# Patient Record
Sex: Female | Born: 1960 | Race: White | Hispanic: No | Marital: Married | Smoking: Never smoker
Health system: Southern US, Community
[De-identification: ages and names within clinical notes are randomized; demographics above are authoritative.]

## PROBLEM LIST (undated history)

## (undated) DIAGNOSIS — Z5189 Encounter for other specified aftercare: Secondary | ICD-10-CM

## (undated) DIAGNOSIS — M1990 Unspecified osteoarthritis, unspecified site: Secondary | ICD-10-CM

## (undated) DIAGNOSIS — Z8489 Family history of other specified conditions: Secondary | ICD-10-CM

## (undated) DIAGNOSIS — K922 Gastrointestinal hemorrhage, unspecified: Secondary | ICD-10-CM

## (undated) DIAGNOSIS — K651 Peritoneal abscess: Secondary | ICD-10-CM

## (undated) DIAGNOSIS — E039 Hypothyroidism, unspecified: Secondary | ICD-10-CM

## (undated) HISTORY — PX: ECTOPIC PREGNANCY SURGERY: SHX613

## (undated) HISTORY — PX: ABCESS DRAINAGE: SHX399

---

## 1998-10-09 ENCOUNTER — Other Ambulatory Visit: Admission: RE | Admit: 1998-10-09 | Discharge: 1998-10-09 | Payer: Self-pay | Admitting: Obstetrics and Gynecology

## 1999-04-09 ENCOUNTER — Inpatient Hospital Stay (HOSPITAL_COMMUNITY): Admission: AD | Admit: 1999-04-09 | Discharge: 1999-04-11 | Payer: Self-pay | Admitting: Obstetrics and Gynecology

## 1999-04-09 ENCOUNTER — Encounter (INDEPENDENT_AMBULATORY_CARE_PROVIDER_SITE_OTHER): Payer: Self-pay | Admitting: Specialist

## 1999-05-12 ENCOUNTER — Other Ambulatory Visit: Admission: RE | Admit: 1999-05-12 | Discharge: 1999-05-12 | Payer: Self-pay | Admitting: Obstetrics and Gynecology

## 1999-06-18 ENCOUNTER — Ambulatory Visit (HOSPITAL_COMMUNITY): Admission: RE | Admit: 1999-06-18 | Discharge: 1999-06-18 | Payer: Self-pay | Admitting: Obstetrics and Gynecology

## 2000-08-11 ENCOUNTER — Other Ambulatory Visit: Admission: RE | Admit: 2000-08-11 | Discharge: 2000-08-11 | Payer: Self-pay | Admitting: Obstetrics and Gynecology

## 2001-08-23 ENCOUNTER — Ambulatory Visit (HOSPITAL_COMMUNITY): Admission: RE | Admit: 2001-08-23 | Discharge: 2001-08-23 | Payer: Self-pay | Admitting: Obstetrics and Gynecology

## 2001-08-23 ENCOUNTER — Encounter: Payer: Self-pay | Admitting: Obstetrics and Gynecology

## 2001-08-31 ENCOUNTER — Other Ambulatory Visit: Admission: RE | Admit: 2001-08-31 | Discharge: 2001-08-31 | Payer: Self-pay | Admitting: Obstetrics and Gynecology

## 2002-09-02 ENCOUNTER — Ambulatory Visit (HOSPITAL_COMMUNITY): Admission: RE | Admit: 2002-09-02 | Discharge: 2002-09-02 | Payer: Self-pay

## 2002-09-12 ENCOUNTER — Other Ambulatory Visit: Admission: RE | Admit: 2002-09-12 | Discharge: 2002-09-12 | Payer: Self-pay | Admitting: Obstetrics and Gynecology

## 2003-09-04 ENCOUNTER — Ambulatory Visit (HOSPITAL_COMMUNITY): Admission: RE | Admit: 2003-09-04 | Discharge: 2003-09-04 | Payer: Self-pay | Admitting: Obstetrics and Gynecology

## 2003-09-22 ENCOUNTER — Other Ambulatory Visit: Admission: RE | Admit: 2003-09-22 | Discharge: 2003-09-22 | Payer: Self-pay | Admitting: Obstetrics and Gynecology

## 2004-09-06 ENCOUNTER — Ambulatory Visit (HOSPITAL_COMMUNITY): Admission: RE | Admit: 2004-09-06 | Discharge: 2004-09-06 | Payer: Self-pay | Admitting: Obstetrics and Gynecology

## 2005-09-07 ENCOUNTER — Ambulatory Visit (HOSPITAL_COMMUNITY): Admission: RE | Admit: 2005-09-07 | Discharge: 2005-09-07 | Payer: Self-pay | Admitting: Obstetrics and Gynecology

## 2006-09-11 ENCOUNTER — Ambulatory Visit (HOSPITAL_COMMUNITY): Admission: RE | Admit: 2006-09-11 | Discharge: 2006-09-11 | Payer: Self-pay | Admitting: Obstetrics and Gynecology

## 2007-09-13 ENCOUNTER — Ambulatory Visit (HOSPITAL_COMMUNITY): Admission: RE | Admit: 2007-09-13 | Discharge: 2007-09-13 | Payer: Self-pay | Admitting: Obstetrics and Gynecology

## 2008-09-17 ENCOUNTER — Ambulatory Visit (HOSPITAL_COMMUNITY): Admission: RE | Admit: 2008-09-17 | Discharge: 2008-09-17 | Payer: Self-pay | Admitting: Obstetrics and Gynecology

## 2009-09-18 ENCOUNTER — Ambulatory Visit (HOSPITAL_COMMUNITY): Admission: RE | Admit: 2009-09-18 | Discharge: 2009-09-18 | Payer: Self-pay | Admitting: Obstetrics and Gynecology

## 2009-09-24 ENCOUNTER — Ambulatory Visit (HOSPITAL_COMMUNITY): Admission: RE | Admit: 2009-09-24 | Discharge: 2009-09-24 | Payer: Self-pay | Admitting: General Surgery

## 2010-08-27 ENCOUNTER — Other Ambulatory Visit (HOSPITAL_COMMUNITY): Payer: Self-pay | Admitting: Obstetrics and Gynecology

## 2010-08-27 DIAGNOSIS — Z1239 Encounter for other screening for malignant neoplasm of breast: Secondary | ICD-10-CM

## 2010-09-20 ENCOUNTER — Ambulatory Visit (HOSPITAL_COMMUNITY)
Admission: RE | Admit: 2010-09-20 | Discharge: 2010-09-20 | Disposition: A | Discharge status: Home / Self Care | Payer: 59 | Source: Ambulatory Visit | Attending: Obstetrics and Gynecology | Admitting: Obstetrics and Gynecology

## 2010-09-20 DIAGNOSIS — Z1231 Encounter for screening mammogram for malignant neoplasm of breast: Secondary | ICD-10-CM | POA: Insufficient documentation

## 2010-09-20 DIAGNOSIS — Z1239 Encounter for other screening for malignant neoplasm of breast: Secondary | ICD-10-CM

## 2011-08-30 ENCOUNTER — Other Ambulatory Visit (HOSPITAL_COMMUNITY): Payer: Self-pay | Admitting: Obstetrics and Gynecology

## 2011-08-30 DIAGNOSIS — Z139 Encounter for screening, unspecified: Secondary | ICD-10-CM

## 2011-09-23 ENCOUNTER — Ambulatory Visit (HOSPITAL_COMMUNITY)
Admission: RE | Admit: 2011-09-23 | Discharge: 2011-09-23 | Disposition: A | Discharge status: Home / Self Care | Payer: 59 | Source: Ambulatory Visit | Attending: Obstetrics and Gynecology | Admitting: Obstetrics and Gynecology

## 2011-09-23 DIAGNOSIS — Z1231 Encounter for screening mammogram for malignant neoplasm of breast: Secondary | ICD-10-CM | POA: Insufficient documentation

## 2011-09-23 DIAGNOSIS — Z139 Encounter for screening, unspecified: Secondary | ICD-10-CM

## 2012-08-24 ENCOUNTER — Other Ambulatory Visit (HOSPITAL_COMMUNITY): Payer: Self-pay | Admitting: Obstetrics and Gynecology

## 2012-08-24 DIAGNOSIS — Z139 Encounter for screening, unspecified: Secondary | ICD-10-CM

## 2012-09-24 ENCOUNTER — Ambulatory Visit (HOSPITAL_COMMUNITY)
Admission: RE | Admit: 2012-09-24 | Discharge: 2012-09-24 | Disposition: A | Discharge status: Home / Self Care | Payer: 59 | Source: Ambulatory Visit | Attending: Obstetrics and Gynecology | Admitting: Obstetrics and Gynecology

## 2012-09-24 DIAGNOSIS — Z139 Encounter for screening, unspecified: Secondary | ICD-10-CM

## 2012-09-24 DIAGNOSIS — Z1231 Encounter for screening mammogram for malignant neoplasm of breast: Secondary | ICD-10-CM | POA: Insufficient documentation

## 2013-10-08 ENCOUNTER — Other Ambulatory Visit (HOSPITAL_COMMUNITY): Payer: Self-pay | Admitting: Obstetrics and Gynecology

## 2013-10-08 DIAGNOSIS — Z1231 Encounter for screening mammogram for malignant neoplasm of breast: Secondary | ICD-10-CM

## 2013-10-15 ENCOUNTER — Ambulatory Visit (HOSPITAL_COMMUNITY)
Admission: RE | Admit: 2013-10-15 | Discharge: 2013-10-15 | Disposition: A | Discharge status: Home / Self Care | Payer: 59 | Source: Ambulatory Visit | Attending: Obstetrics and Gynecology | Admitting: Obstetrics and Gynecology

## 2013-10-15 DIAGNOSIS — Z1231 Encounter for screening mammogram for malignant neoplasm of breast: Secondary | ICD-10-CM | POA: Insufficient documentation

## 2016-02-18 ENCOUNTER — Inpatient Hospital Stay (HOSPITAL_COMMUNITY)
Admission: EM | Admit: 2016-02-18 | Discharge: 2016-02-23 | DRG: 392 | Disposition: A | Discharge status: Home / Self Care | Payer: 59 | Attending: Internal Medicine | Admitting: Internal Medicine

## 2016-02-18 ENCOUNTER — Other Ambulatory Visit (HOSPITAL_COMMUNITY): Payer: Self-pay | Admitting: Family Medicine

## 2016-02-18 ENCOUNTER — Ambulatory Visit (HOSPITAL_COMMUNITY)
Admission: RE | Admit: 2016-02-18 | Discharge: 2016-02-18 | Disposition: A | Discharge status: Home / Self Care | Payer: 59 | Source: Ambulatory Visit | Attending: Family Medicine | Admitting: Family Medicine

## 2016-02-18 ENCOUNTER — Encounter (HOSPITAL_COMMUNITY): Payer: Self-pay | Admitting: Cardiology

## 2016-02-18 DIAGNOSIS — Z7982 Long term (current) use of aspirin: Secondary | ICD-10-CM

## 2016-02-18 DIAGNOSIS — K449 Diaphragmatic hernia without obstruction or gangrene: Secondary | ICD-10-CM | POA: Insufficient documentation

## 2016-02-18 DIAGNOSIS — D509 Iron deficiency anemia, unspecified: Secondary | ICD-10-CM | POA: Diagnosis present

## 2016-02-18 DIAGNOSIS — K651 Peritoneal abscess: Secondary | ICD-10-CM

## 2016-02-18 DIAGNOSIS — K8020 Calculus of gallbladder without cholecystitis without obstruction: Secondary | ICD-10-CM

## 2016-02-18 DIAGNOSIS — N83201 Unspecified ovarian cyst, right side: Secondary | ICD-10-CM | POA: Insufficient documentation

## 2016-02-18 DIAGNOSIS — E876 Hypokalemia: Secondary | ICD-10-CM | POA: Diagnosis present

## 2016-02-18 DIAGNOSIS — IMO0002 Reserved for concepts with insufficient information to code with codable children: Secondary | ICD-10-CM | POA: Diagnosis present

## 2016-02-18 DIAGNOSIS — D72829 Elevated white blood cell count, unspecified: Secondary | ICD-10-CM | POA: Diagnosis not present

## 2016-02-18 DIAGNOSIS — Z79899 Other long term (current) drug therapy: Secondary | ICD-10-CM

## 2016-02-18 DIAGNOSIS — K5720 Diverticulitis of large intestine with perforation and abscess without bleeding: Secondary | ICD-10-CM | POA: Diagnosis present

## 2016-02-18 DIAGNOSIS — D649 Anemia, unspecified: Secondary | ICD-10-CM | POA: Diagnosis present

## 2016-02-18 DIAGNOSIS — E039 Hypothyroidism, unspecified: Secondary | ICD-10-CM | POA: Diagnosis present

## 2016-02-18 DIAGNOSIS — R1032 Left lower quadrant pain: Secondary | ICD-10-CM

## 2016-02-18 HISTORY — DX: Hypothyroidism, unspecified: E03.9

## 2016-02-18 HISTORY — DX: Unspecified osteoarthritis, unspecified site: M19.90

## 2016-02-18 LAB — CBC WITH DIFFERENTIAL/PLATELET
BASOS ABS: 0 10*3/uL (ref 0.0–0.1)
BASOS PCT: 0 %
EOS PCT: 1 %
Eosinophils Absolute: 0.1 10*3/uL (ref 0.0–0.7)
HCT: 35.5 % — ABNORMAL LOW (ref 36.0–46.0)
Hemoglobin: 11.4 g/dL — ABNORMAL LOW (ref 12.0–15.0)
Lymphocytes Relative: 15 %
Lymphs Abs: 2 10*3/uL (ref 0.7–4.0)
MCH: 27.4 pg (ref 26.0–34.0)
MCHC: 32.1 g/dL (ref 30.0–36.0)
MCV: 85.3 fL (ref 78.0–100.0)
MONO ABS: 1.1 10*3/uL — AB (ref 0.1–1.0)
Monocytes Relative: 8 %
Neutro Abs: 10.3 10*3/uL — ABNORMAL HIGH (ref 1.7–7.7)
Neutrophils Relative %: 76 %
PLATELETS: 349 10*3/uL (ref 150–400)
RBC: 4.16 MIL/uL (ref 3.87–5.11)
RDW: 15.7 % — AB (ref 11.5–15.5)
WBC: 13.5 10*3/uL — ABNORMAL HIGH (ref 4.0–10.5)

## 2016-02-18 LAB — BASIC METABOLIC PANEL
Anion gap: 8 (ref 5–15)
BUN: 12 mg/dL (ref 6–20)
CALCIUM: 8.5 mg/dL — AB (ref 8.9–10.3)
CO2: 22 mmol/L (ref 22–32)
CREATININE: 0.71 mg/dL (ref 0.44–1.00)
Chloride: 106 mmol/L (ref 101–111)
GFR calc Af Amer: >60 mL/min (ref >60)
GFR calc non Af Amer: >60 mL/min (ref >60)
Glucose, Bld: 95 mg/dL (ref 65–99)
POTASSIUM: 3.5 mmol/L (ref 3.5–5.1)
SODIUM: 136 mmol/L (ref 135–145)

## 2016-02-18 LAB — PROTIME-INR
INR: 1.22 (ref 0.00–1.49)
PROTHROMBIN TIME: 15.6 s — AB (ref 11.6–15.2)

## 2016-02-18 MED ORDER — SODIUM CHLORIDE 0.9 % IV SOLN
1000.0000 mL | Freq: Once | INTRAVENOUS | Status: AC
  Administered 2016-02-18: 1000 mL via INTRAVENOUS

## 2016-02-18 MED ORDER — HYDROMORPHONE HCL 1 MG/ML IJ SOLN
0.5000 mg | INTRAMUSCULAR | Status: DC | PRN
  Administered 2016-02-19 – 2016-02-20 (×2): 0.5 mg via INTRAVENOUS
  Filled 2016-02-18 (×3): qty 1

## 2016-02-18 MED ORDER — LEVOTHYROXINE SODIUM 25 MCG PO TABS
25.0000 ug | ORAL_TABLET | Freq: Every day | ORAL | Status: DC
  Administered 2016-02-19 – 2016-02-23 (×5): 25 ug via ORAL
  Filled 2016-02-18 (×6): qty 1

## 2016-02-18 MED ORDER — ACETAMINOPHEN 650 MG RE SUPP: 650.0000 mg | Freq: Four times a day (QID) | RECTAL | Status: DC | PRN

## 2016-02-18 MED ORDER — ACETAMINOPHEN 325 MG PO TABS
650.0000 mg | ORAL_TABLET | Freq: Four times a day (QID) | ORAL | Status: DC | PRN
  Administered 2016-02-19: 650 mg via ORAL
  Filled 2016-02-18: qty 2

## 2016-02-18 MED ORDER — IOPAMIDOL (ISOVUE-300) INJECTION 61%
100.0000 mL | Freq: Once | INTRAVENOUS | Status: AC | PRN
  Administered 2016-02-18: 100 mL via INTRAVENOUS

## 2016-02-18 MED ORDER — ASPIRIN EC 81 MG PO TBEC
81.0000 mg | DELAYED_RELEASE_TABLET | Freq: Every day | ORAL | Status: DC
  Administered 2016-02-19 – 2016-02-23 (×5): 81 mg via ORAL
  Filled 2016-02-18 (×5): qty 1

## 2016-02-18 MED ORDER — PIPERACILLIN-TAZOBACTAM 3.375 G IVPB
3.3750 g | Freq: Three times a day (TID) | INTRAVENOUS | Status: DC
  Administered 2016-02-18 – 2016-02-23 (×14): 3.375 g via INTRAVENOUS
  Filled 2016-02-18 (×18): qty 50

## 2016-02-18 MED ORDER — SODIUM CHLORIDE 0.9 % IV SOLN
1000.0000 mL | INTRAVENOUS | Status: DC
  Administered 2016-02-18 – 2016-02-20 (×5): 1000 mL via INTRAVENOUS

## 2016-02-18 MED ORDER — ONDANSETRON HCL 4 MG PO TABS: 4.0000 mg | ORAL_TABLET | Freq: Four times a day (QID) | ORAL | Status: DC | PRN

## 2016-02-18 MED ORDER — ONDANSETRON HCL 4 MG/2ML IJ SOLN: 4.0000 mg | Freq: Four times a day (QID) | INTRAMUSCULAR | Status: DC | PRN

## 2016-02-18 MED ORDER — PIPERACILLIN-TAZOBACTAM 3.375 G IVPB 30 MIN
3.3750 g | Freq: Once | INTRAVENOUS | Status: AC
  Administered 2016-02-18: 3.375 g via INTRAVENOUS
  Filled 2016-02-18: qty 50

## 2016-02-18 NOTE — ED Notes (Signed)
Pt ambulatory to the bathroom, family at the bedside.  

## 2016-02-18 NOTE — ED Notes (Signed)
Pt non-emergency by EMS, fluids stopped. [facility]. Pt ambulatory to the bathroom before transport.  [facility] given report, paper work done.

## 2016-02-18 NOTE — ED Notes (Signed)
Abdominal pain times 2 weeks.  Had OP CT today and was told to come to the ER for an abscess.

## 2016-02-18 NOTE — ED Notes (Signed)
Pt ambulatory to the bathroom 

## 2016-02-18 NOTE — Progress Notes (Addendum)
Patient admitted overnight by hospitalist service. His diverticular abscess. IR consulted and aware of need for placement of percutaneous drain.  Junious SilkAllison Caresse Sedivy, ANP for Berton MountSylvester Ogbata, MD

## 2016-02-18 NOTE — ED Notes (Signed)
Hospitalist at the bedside 

## 2016-02-18 NOTE — H&P (Signed)
History and Physical  Kelsey King ZOX:[id number] DOB: 11/07/1960 DOA: 02/18/2016  Referring physician: Dr Patria Mane, ED physician PCP: Colette Ribas, MD  Outpatient Specialists:   [facility] ([facility])  Chief Complaint: Left lower abdominal pain  HPI: Kelsey King is a 55 y.o. 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 Patient seen for nonradiating, moderate left lower quadrant pain 10-14 days it has been worsening. She had the pain in her left lower quadrant for approximately 1 week, then improved for a day or 2, then became worse again. Was seen at an urgent care and treated with ciprofloxacin for UTI approximately 5 days ago, which helped with the pain briefly. When her pain worsen, she went to her primary care doctor, who ordered a CT scan, which she obtained this morning. She was subsequently told to go to the emergency department due to a possible abscess. She has been having slight fever, chills intermittently, night sweats. She denies nausea, vomiting, diarrhea, constipation. She denies blood in her stool or melena.   Review of Systems:   Pt denies any nausea, vomiting, diarrhea, constipation, shortness of breath, dyspnea on exertion, orthopnea, cough, wheezing, palpitations, headache, vision changes, lightheadedness, dizziness, melena, rectal bleeding.  Review of systems are otherwise negative  Past Medical History  Diagnosis Date  . Arthritis    Past Surgical History  Procedure Laterality Date  . Ectopic pregnancy surgery     Social History:  reports that she has never smoked. She does not have any smokeless tobacco history on file. She reports that she does not drink alcohol or use illicit drugs. Patient lives at Home  No Known Allergies  Family History  Problem Relation Age of Onset  . Breast cancer Maternal Grandmother   . Cancer Father     jaw cancer  . Diverticulosis Mother     . Diabetes Sister       Prior to Admission medications   Medication Sig Start Date End Date Taking? Authorizing Provider  aspirin EC 81 MG tablet Take 81 mg by mouth daily.   Yes Historical Provider, MD  calcium carbonate (OS-CAL - DOSED IN MG OF ELEMENTAL CALCIUM) 1250 (500 Ca) MG tablet Take 1 tablet by mouth daily.   Yes Historical Provider, MD  ciprofloxacin (CIPRO) 500 MG tablet Take 500 mg by mouth 2 (two) times daily. For 7 days, starting on 02/13/16   Yes Historical Provider, MD  levothyroxine (SYNTHROID, LEVOTHROID) 25 MCG tablet Take 25 mcg by mouth daily before breakfast.   Yes Historical Provider, MD  Multiple Vitamins-Minerals (MULTIVITAMINS THER. W/MINERALS) TABS tablet Take 1 tablet by mouth daily.   Yes Historical Provider, MD    Physical Exam: BP 154/100 mmHg  Pulse 93  Temp(Src) 99 F (37.2 C) (Oral)  Resp 16  Ht 5\' 7"  (1.702 m)  Wt 175 lb (79.379 kg)  BMI 27.40 kg/m2  SpO2 99%  General: Middle-age Caucasian female. Awake and alert and oriented x3. No acute cardiopulmonary distress.  HEENT: Normocephalic atraumatic.  Right and left ears normal in appearance.  Pupils equal, round, reactive to light. Extraocular muscles are intact. Sclerae anicteric and noninjected.  Moist mucosal membranes. No mucosal lesions.  Neck: Neck supple without lymphadenopathy. No carotid bruits. No masses palpated.  Cardiovascular: Regular rate with normal S1-S2 sounds. No murmurs, rubs, gallops auscultated. No JVD.  Respiratory: Good respiratory effort with no wheezes, rales, rhonchi. Lungs clear to auscultation bilaterally.  No accessory  muscle use. Abdomen: Soft, nondistended. Tender in the left lower quadrant. Mild guarding, no rebound tenderness. Active bowel sounds. No masses or hepatosplenomegaly  Skin: No rashes, lesions, or ulcerations.  Dry, warm to touch. 2+ dorsalis pedis and radial pulses. Musculoskeletal: No calf or leg pain. All major joints not erythematous nontender.  No upper  or lower joint deformation.  Good ROM.  No contractures  Psychiatric: Intact judgment and insight. Pleasant and cooperative. Neurologic: No focal neurological deficits. Strength is 5/5 and symmetric in upper and lower extremities.  Cranial nerves II through XII are grossly intact.           Labs on Admission: I have personally reviewed following labs and imaging studies  CBC:  Recent Labs Lab 02/18/16 1425  WBC 13.5*  NEUTROABS 10.3*  HGB 11.4*  HCT 35.5*  MCV 85.3  PLT 349   Basic Metabolic Panel:  Recent Labs Lab 02/18/16 1425  NA 136  K 3.5  CL 106  CO2 22  GLUCOSE 95  BUN 12  CREATININE 0.71  CALCIUM 8.5*   GFR: Estimated Creatinine Clearance: 85.2 mL/min (by C-G formula based on Cr of 0.71). Liver Function Tests: No results for input(s): AST, ALT, ALKPHOS, BILITOT, PROT, ALBUMIN in the last 168 hours. No results for input(s): LIPASE, AMYLASE in the last 168 hours. No results for input(s): AMMONIA in the last 168 hours. Coagulation Profile:  Recent Labs Lab 02/18/16 1425  INR 1.22   Cardiac Enzymes: No results for input(s): CKTOTAL, CKMB, CKMBINDEX, TROPONINI in the last 168 hours. BNP (last 3 results) No results for input(s): PROBNP in the last 8760 hours. HbA1C: No results for input(s): HGBA1C in the last 72 hours. CBG: No results for input(s): GLUCAP in the last 168 hours. Lipid Profile: No results for input(s): CHOL, HDL, LDLCALC, TRIG, CHOLHDL, LDLDIRECT in the last 72 hours. Thyroid Function Tests: No results for input(s): TSH, T4TOTAL, FREET4, T3FREE, THYROIDAB in the last 72 hours. Anemia Panel: No results for input(s): VITAMINB12, FOLATE, FERRITIN, TIBC, IRON, RETICCTPCT in the last 72 hours. Urine analysis: No results found for: COLORURINE, APPEARANCEUR, LABSPEC, PHURINE, GLUCOSEU, HGBUR, BILIRUBINUR, KETONESUR, PROTEINUR, UROBILINOGEN, NITRITE, LEUKOCYTESUR Sepsis Labs: @LABRCNTIP (procalcitonin:4,lacticidven:4) )No results found for  this or any previous visit (from the past 240 hour(s)).   Radiological Exams on Admission: Ct Abdomen Pelvis W Contrast  02/18/2016  CLINICAL DATA:  55 year old female with left abdominal and pelvic pain for 3 days. EXAM: CT ABDOMEN AND PELVIS WITH CONTRAST TECHNIQUE: Multidetector CT imaging of the abdomen and pelvis was performed using the standard protocol following bolus administration of intravenous contrast. CONTRAST:  100mL ISOVUE-300 IOPAMIDOL (ISOVUE-300) INJECTION 61% COMPARISON:  None. FINDINGS: Lower chest:  A moderate hiatal hernia is noted. Hepatobiliary: The liver is unremarkable. Cholelithiasis identified without CT evidence of acute cholecystitis. There is no evidence of biliary dilatation. Pancreas: Unremarkable Spleen: Unremarkable Adrenals/Urinary Tract: The kidneys, adrenal glands and bladder are unremarkable. Stomach/Bowel: A 3.7 x 6.6 cm fluid and gas collection within the left pelvis/adnexal region lies adjacent tube thickened sigmoid colon and compatible with an abscess. Adjacent inflammation is noted. Extensive sigmoid colonic diverticulosis noted. There is no evidence of bowel obstruction. The appendix is normal. Vascular/Lymphatic: Abdominal aortic atherosclerotic calcifications noted without aneurysm. No enlarged lymph nodes identified. Reproductive: The uterus is unremarkable. A 3 cm right ovarian cyst is noted. Other: No free fluid. Musculoskeletal: No acute abnormality or suspicious lesions. IMPRESSION: 3.7 x 6.6 cm abscess within the left pelvis/adnexal region, adjacent to thickened sigmoid colon. It  is difficult to determine if this represents a tubo-ovarian abscess or abscess originating from the colon/diverticulitis. 3 cm right ovarian cyst. Recommend ultrasound for further characterization. Cholelithiasis without CT evidence of acute cholecystitis. Moderate hiatal hernia. Electronically Signed   By: Harmon Pier M.D.   On: 02/18/2016 12:55    Assessment/Plan: Active  Problems:   Abdominal abscess [facility])    This patient was discussed with the ED physician, including pertinent vitals, physical exam findings, labs, and imaging.  We also discussed care given by the ED provider.  #1 abdominal abscess  Likely diverticular, admitted to [facility] to MedSurg  CBC tomorrow,   IR consulted for drainage tomorrow  Nothing by mouth after midnight  Continue IV fluids  Continue Zosyn  DVT prophylaxis: SCDs Consultants: Interventional radiology Code Status: Full code Family Communication: Husband in the room  Disposition Plan: Likely discharge to home in 3-4 days   Levie Heritage, DO [facility] Pager [phone]  If 7PM-7AM, please contact night-coverage [email or web address] Password [email or web address]

## 2016-02-18 NOTE — ED Notes (Signed)
Dr. Patria Maneampos in with pt

## 2016-02-18 NOTE — ED Notes (Signed)
[facility]

## 2016-02-18 NOTE — Progress Notes (Signed)
Pharmacy Antibiotic Note  Jerene Pitchnne W Sanluis is a 55 y.o. female admitted on 02/18/2016 with intra abdominal infection.  Pharmacy has been consulted for zosyn dosing.  Plan: Zosyn 3.375g IV q8h (4 hour infusion).  Height: 5\' 7"  (170.2 cm) Weight: 175 lb (79.379 kg) IBW/kg (Calculated) : 61.6  Temp (24hrs), Avg:99 F (37.2 C), Min:99 F (37.2 C), Max:99 F (37.2 C)   Recent Labs Lab 02/18/16 1425  WBC 13.5*  CREATININE 0.71    Estimated Creatinine Clearance: 85.2 mL/min (by C-G formula based on Cr of 0.71).    No Known Allergies  Antimicrobials this admission: zosyn 7/13 >>   Thank you for allowing pharmacy to be a part of this patient's care.  Woodfin GanjaSeay, Deandria Klute Poteet 02/18/2016 4:34 PM

## 2016-02-18 NOTE — ED Notes (Signed)
Pt eating dinner tray, to be NPO after midnight

## 2016-02-18 NOTE — ED Provider Notes (Signed)
CSN: [id number]     Arrival date & time 02/18/16  1341 History  By signing my name below, I, Majel HomerPeyton Lee, attest that this documentation has been prepared under the direction and in the presence of Azalia BilisKevin Dot Splinter, MD . Electronically Signed: Majel HomerPeyton Lee, Scribe. 02/18/2016. 2:10 PM.   Chief Complaint  Patient presents with  . Abdominal Pain   The history is provided by the patient. No language interpreter was used.   HPI Comments: Kelsey King is a 55 y.o. female who presents to the Emergency Department complaining of gradually worsening, left-sided lower abdominal pain that began ~1 week ago and worsened PTA. Pt reports associated loss of appetite, fever (TMAX 100) that occurred a few days ago, and pain when walking and during certain movements. Pt states she was seen at Urgent Care 5 days ago for her symptoms in which she was prescribed Cipro. She notes her pain was spontaneously relieved and then became gradually worse recently. She denies any pain currently in the ED. Pt states she had a CT scan this morning and was told to visit the ED due to a possible abscess. She denies nausea and use of blood thinners. She also denies hx of heart of lung disease.   Past Medical History  Diagnosis Date  . Arthritis    Past Surgical History  Procedure Laterality Date  . Ectopic pregnancy surgery     History reviewed. No pertinent family history. Social History  Substance Use Topics  . Smoking status: Never Smoker   . Smokeless tobacco: None  . Alcohol Use: No   OB History    No data available     Review of Systems 10 systems reviewed and all are negative for acute change except as noted in the HPI.  Allergies  Review of patient's allergies indicates no known allergies.  Home Medications   Prior to Admission medications   Not on File   BP 158/92 mmHg  Pulse 91  Temp(Src) 99 F (37.2 C) (Oral)  Resp 16  Ht 5\' 7"  (1.702 m)  Wt 175 lb (79.379 kg)  BMI 27.40 kg/m2  SpO2  100% Physical Exam  Constitutional: She is oriented to person, place, and time. She appears well-developed and well-nourished.  HENT:  Head: Normocephalic.  Eyes: EOM are normal.  Neck: Normal range of motion.  Pulmonary/Chest: Effort normal.  Abdominal: She exhibits no distension. There is no rebound and no guarding.  Tenderness to LLQ without guarding or rebound   Musculoskeletal: Normal range of motion.  Neurological: She is alert and oriented to person, place, and time.  Psychiatric: She has a normal mood and affect.  Nursing note and vitals reviewed.   ED Course  Procedures  DIAGNOSTIC STUDIES:  Oxygen Saturation is 100% on RA, normal by my interpretation.    COORDINATION OF CARE:  2:00 PM Discussed treatment plan with pt at bedside and pt agreed to plan.  Labs Review Labs Reviewed  PROTIME-INR - Abnormal; Notable for the following:    Prothrombin Time 15.6 (*)    All other components within normal limits  CBC WITH DIFFERENTIAL/PLATELET - Abnormal; Notable for the following:    WBC 13.5 (*)    Hemoglobin 11.4 (*)    HCT 35.5 (*)    RDW 15.7 (*)    Neutro Abs 10.3 (*)    Monocytes Absolute 1.1 (*)    All other components within normal limits  BASIC METABOLIC PANEL    Imaging Review Ct Abdomen Pelvis W Contrast  02/18/2016  CLINICAL DATA:  54 year old female with left abdominal and pelvic pain for 3 days. EXAM: CT ABDOMEN AND PELVIS WITH CONTRAST TECHNIQUE: Multidetector CT imaging of the abdomen and pelvis was performed using the standard protocol following bolus administration of intravenous contrast. CONTRAST:  ISOVUE-300 IOPAMIDOL (ISOVUE-300) INJECTION 61% COMPARISON:  None. FINDINGS: Lower chest:  A moderate hiatal hernia is noted. Hepatobiliary: The liver is unremarkable. Cholelithiasis identified without CT evidence of acute cholecystitis. There is no evidence of biliary dilatation. Pancreas: Unremarkable Spleen: Unremarkable Adrenals/Urinary Tract: The  kidneys, adrenal glands and bladder are unremarkable. Stomach/Bowel: A 3.7 x 6.6 cm fluid and gas collection within the left pelvis/adnexal region lies adjacent tube thickened sigmoid colon and compatible with an abscess. Adjacent inflammation is noted. Extensive sigmoid colonic diverticulosis noted. There is no evidence of bowel obstruction. The appendix is normal. Vascular/Lymphatic: Abdominal aortic atherosclerotic calcifications noted without aneurysm. No enlarged lymph nodes identified. Reproductive: The uterus is unremarkable. A 3 cm right ovarian cyst is noted. Other: No free fluid. Musculoskeletal: No acute abnormality or suspicious lesions. IMPRESSION: 3.7 x 6.6 cm abscess within the left pelvis/adnexal region, adjacent to thickened sigmoid colon. It is difficult to determine if this represents a tubo-ovarian abscess or abscess originating from the colon/diverticulitis. 3 cm right ovarian cyst. Recommend ultrasound for further characterization. Cholelithiasis without CT evidence of acute cholecystitis. Moderate hiatal hernia. Electronically Signed   By: Harmon Pier M.D.   On: 02/18/2016 12:55   I have personally reviewed and evaluated these images and lab results as part of my medical decision-making.   EKG Interpretation None      MDM   Final diagnoses:  Colonic diverticular abscess    Patient will be started on IV Zosyn for intra-abdominal abscess.  This appears to be a colonic/diverticular abscess given the history and the findings.  CT scan demonstrates the abscess.  I spoke with Dr. Maryclare Bean of interventional radiology who states this abscess will be easily approached with CT-guided drainage.  This is the appropriate course of action for her.  She will need to be admitted to the hospital and transferred to [facility] with plan for IR involvement and drainage tomorrow.  IV Zosyn now.  Overall well-appearing.  Nontoxic.  Patient and family agreeable to plan.  I will very low suspicion that  this is a tubo-ovarian abscess given her age and comorbidities and monogamous relationship  I personally performed the services described in this documentation, which was scribed in my presence. The recorded information has been reviewed and is accurate.       Azalia Bilis, MD 02/18/16 [phone]

## 2016-02-19 ENCOUNTER — Encounter (HOSPITAL_COMMUNITY): Payer: Self-pay | Admitting: General Surgery

## 2016-02-19 DIAGNOSIS — D72829 Elevated white blood cell count, unspecified: Secondary | ICD-10-CM

## 2016-02-19 DIAGNOSIS — K5720 Diverticulitis of large intestine with perforation and abscess without bleeding: Principal | ICD-10-CM

## 2016-02-19 DIAGNOSIS — D649 Anemia, unspecified: Secondary | ICD-10-CM | POA: Diagnosis present

## 2016-02-19 DIAGNOSIS — K651 Peritoneal abscess: Secondary | ICD-10-CM

## 2016-02-19 LAB — CBC
HEMATOCRIT: 33.1 % — AB (ref 36.0–46.0)
Hemoglobin: 10.6 g/dL — ABNORMAL LOW (ref 12.0–15.0)
MCH: 27.6 pg (ref 26.0–34.0)
MCHC: 32 g/dL (ref 30.0–36.0)
MCV: 86.2 fL (ref 78.0–100.0)
Platelets: 325 10*3/uL (ref 150–400)
RBC: 3.84 MIL/uL — ABNORMAL LOW (ref 3.87–5.11)
RDW: 15.7 % — AB (ref 11.5–15.5)
WBC: 9.9 10*3/uL (ref 4.0–10.5)

## 2016-02-19 NOTE — Progress Notes (Signed)
PROGRESS NOTE    [name]  ZOX:[id number] DOB: 10-09-1960 DOA: 02/18/2016 PCP: Colette Ribas, MD    Brief Narrative:  Kelsey King is a 55 y.o. 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 Patient seen for nonradiating, moderate left lower quadrant pain 10-14 days it has been worsening. She had the pain in her left lower quadrant for approximately 1 week, then improved for a day or 2, then became worse again. Was seen at an urgent care and treated with ciprofloxacin for UTI approximately 5 days ago, which helped with the pain briefly. When her pain worsen, she went to her primary care doctor, who ordered a CT scan, which she obtained this morning. She was subsequently told to go to the emergency department due to a possible abscess. She has been having slight fever, chills intermittently, night sweats. She denies nausea, vomiting, diarrhea, constipation. She denies blood in her stool or melena.    Assessment & Plan:   Principal Problem:   Abdominal abscess (HCC) Active Problems:   Colonic diverticular abscess   Leukocytosis   Anemia  #1 abdominal abscess/probable diverticular abscess Patient presented with abdominal pain, leukocytosis CT scan with a 3.7 x 6.6 cm abscess within the left pelvis/adnexal region, adjacent to thickened sigmoid colon. Patient currently afebrile. Interventional radiology has been consulted and patient will be scheduled for placement of percutaneous drain tomorrow. Continue empiric IV antibiotics/Zosyn. Supportive care, pain management.  #2 leukocytosis Likely secondary to problem #1. WBC trending down. Continue empiric IV antibiotics.  #3 anemia Patient with no overt bleeding. Check an anemia panel. Follow H&H.   DVT prophylaxis: scd Code Status: Full Family Communication: Updated patient no family present. Disposition Plan: Remain inpatient.   Consultants:    IR  GI; Dr Dulce Sellar 02/19/2016  Procedures:   CT abd/pelvis 02/18/2016  Antimicrobials:   IV Zosyn 02/18/2016    Subjective: Patient denies any chest. No shortness of breath. Patient still complains of left lower quadrant pain. No nausea. No emesis.  Objective: Filed Vitals:   02/18/16 1953 02/18/16 2007 02/19/16 0500 02/19/16 1513  BP: 143/82  114/64 130/72  Pulse: 99  85 82  Temp: 98.9 F (37.2 C)  98.5 F (36.9 C) 97.9 F (36.6 C)  TempSrc:   Oral Oral  Resp: 18  18 17   Height:  5\' 7"  (1.702 m)    Weight:  79.379 kg (175 lb)    SpO2: 98%  96% 98%    Intake/Output Summary (Last 24 hours) at 02/19/16 1825 Last data filed at 02/19/16 1532  Gross per 24 hour  Intake 1525.42 ml  Output   1800 ml  Net -274.58 ml   Filed Weights   02/18/16 1345 02/18/16 2007  Weight: 79.379 kg (175 lb) 79.379 kg (175 lb)    Examination:  General exam: Appears calm and comfortable  Respiratory system: Clear to auscultation. Respiratory effort normal. Cardiovascular system: S1 & S2 heard, RRR. No JVD, murmurs, rubs, gallops or clicks. No pedal edema. Gastrointestinal system: Abdomen is nondistended, soft. No organomegaly or masses felt. Normal bowel sounds heard. LLQ with TTP. Central nervous system: Alert and oriented. No focal neurological deficits. Extremities: Symmetric 5 x 5 power. Skin: No rashes, lesions or ulcers Psychiatry: Judgement and insight appear normal. Mood & affect appropriate.     Data Reviewed: I have personally reviewed following labs and imaging studies  CBC:  Recent Labs Lab 02/18/16 1425 02/19/16 8119  WBC 13.5* 9.9  NEUTROABS 10.3*  --   HGB 11.4* 10.6*  HCT 35.5* 33.1*  MCV 85.3 86.2  PLT 349 325   Basic Metabolic Panel:  Recent Labs Lab 02/18/16 1425  NA 136  K 3.5  CL 106  CO2 22  GLUCOSE 95  BUN 12  CREATININE 0.71  CALCIUM 8.5*   GFR: Estimated Creatinine Clearance: 85.2 mL/min (by C-G formula based on Cr of 0.71). Liver  Function Tests: No results for input(s): AST, ALT, ALKPHOS, BILITOT, PROT, ALBUMIN in the last 168 hours. No results for input(s): LIPASE, AMYLASE in the last 168 hours. No results for input(s): AMMONIA in the last 168 hours. Coagulation Profile:  Recent Labs Lab 02/18/16 1425  INR 1.22   Cardiac Enzymes: No results for input(s): CKTOTAL, CKMB, CKMBINDEX, TROPONINI in the last 168 hours. BNP (last 3 results) No results for input(s): PROBNP in the last 8760 hours. HbA1C: No results for input(s): HGBA1C in the last 72 hours. CBG: No results for input(s): GLUCAP in the last 168 hours. Lipid Profile: No results for input(s): CHOL, HDL, LDLCALC, TRIG, CHOLHDL, LDLDIRECT in the last 72 hours. Thyroid Function Tests: No results for input(s): TSH, T4TOTAL, FREET4, T3FREE, THYROIDAB in the last 72 hours. Anemia Panel: No results for input(s): VITAMINB12, FOLATE, FERRITIN, TIBC, IRON, RETICCTPCT in the last 72 hours. Sepsis Labs: No results for input(s): PROCALCITON, LATICACIDVEN in the last 168 hours.  No results found for this or any previous visit (from the past 240 hour(s)).       Radiology Studies: Ct Abdomen Pelvis W Contrast  02/18/2016  CLINICAL DATA:  55 year old female with left abdominal and pelvic pain for 3 days. EXAM: CT ABDOMEN AND PELVIS WITH CONTRAST TECHNIQUE: Multidetector CT imaging of the abdomen and pelvis was performed using the standard protocol following bolus administration of intravenous contrast. CONTRAST:  ISOVUE-300 IOPAMIDOL (ISOVUE-300) INJECTION 61% COMPARISON:  None. FINDINGS: Lower chest:  A moderate hiatal hernia is noted. Hepatobiliary: The liver is unremarkable. Cholelithiasis identified without CT evidence of acute cholecystitis. There is no evidence of biliary dilatation. Pancreas: Unremarkable Spleen: Unremarkable Adrenals/Urinary Tract: The kidneys, adrenal glands and bladder are unremarkable. Stomach/Bowel: A 3.7 x 6.6 cm fluid and gas  collection within the left pelvis/adnexal region lies adjacent tube thickened sigmoid colon and compatible with an abscess. Adjacent inflammation is noted. Extensive sigmoid colonic diverticulosis noted. There is no evidence of bowel obstruction. The appendix is normal. Vascular/Lymphatic: Abdominal aortic atherosclerotic calcifications noted without aneurysm. No enlarged lymph nodes identified. Reproductive: The uterus is unremarkable. A 3 cm right ovarian cyst is noted. Other: No free fluid. Musculoskeletal: No acute abnormality or suspicious lesions. IMPRESSION: 3.7 x 6.6 cm abscess within the left pelvis/adnexal region, adjacent to thickened sigmoid colon. It is difficult to determine if this represents a tubo-ovarian abscess or abscess originating from the colon/diverticulitis. 3 cm right ovarian cyst. Recommend ultrasound for further characterization. Cholelithiasis without CT evidence of acute cholecystitis. Moderate hiatal hernia. Electronically Signed   By: Harmon Pier M.D.   On: 02/18/2016 12:55        Scheduled Meds: . aspirin EC  81 mg Oral Daily  . levothyroxine  25 mcg Oral QAC breakfast  . piperacillin-tazobactam (ZOSYN)  IV  3.375 g Intravenous Q8H   Continuous Infusions: . sodium chloride 1,000 mL (02/19/16 1533)     LOS: 1 day    Time spent: 35 mins    Jansen Sciuto, MD [facility] Pager [phone]  If 7PM-7AM, please contact  night-coverage [email or web address] Password [email or web address] 02/19/2016, 6:25 PM

## 2016-02-19 NOTE — Progress Notes (Signed)
Patient ID: Kelsey King, female   DOB: 12/13/1960, 55 y.o.   MRN: [id number]   Pt must be rescheduled to 7/15 for IR procedure secondary IR schedule today See new NPO orders for 7/15  RN aware Please inform pt

## 2016-02-19 NOTE — Consult Note (Signed)
Chief Complaint: diverticulitis with abscess  Referring Physician:Dr. Loma Boston  Supervising Physician: Corrie Mckusick  Patient Status: In-pt   HPI: Kelsey King is an 55 y.o. female who began having some abdominal pain about 2 weeks ago, but it went away on its own and the patient just thought it was gas.  Last Friday, the pain returned.  She went to an urgent care who diagnosed her with possible UTI and possible diverticulitis.  He put her on medication and told her to follow up with her PCP if she did not improve.  She ran a slight fever of 100 on Saturday, but none since then.  Her pain persisted and she saw her PCP who ordered a CT scan for Thursday.  This revealed diverticulitis with intra-abdominal abscess.  She was sent here to [facility] for admission.  She denies any N/V/D, but her stool has had more mucous present.  Her appetite has been poor.  We have been asked to evaluate the patient for percutaneous drain placement.  Past Medical History:  Past Medical History  Diagnosis Date  . Arthritis   hypothyroidism  Past Surgical History:  Past Surgical History  Procedure Laterality Date  . Ectopic pregnancy surgery      Family History:  Family History  Problem Relation Age of Onset  . Breast cancer Maternal Grandmother   . Cancer Father     jaw cancer  . Diverticulosis Mother   . Diabetes Sister     Social History:  reports that she has never smoked. She does not have any smokeless tobacco history on file. She reports that she does not drink alcohol or use illicit drugs.  Allergies: No Known Allergies  Medications:   Medication List    ASK your doctor about these medications        aspirin EC 81 MG tablet  Take 81 mg by mouth daily.     calcium carbonate 1250 (500 Ca) MG tablet  Commonly known as:  OS-CAL - dosed in mg of elemental calcium  Take 1 tablet by mouth daily.     ciprofloxacin 500 MG tablet  Commonly known as:  CIPRO  Take 500 mg by mouth 2  (two) times daily. For 7 days, starting on 02/13/16     levothyroxine 25 MCG tablet  Commonly known as:  SYNTHROID, LEVOTHROID  Take 25 mcg by mouth daily before breakfast.     multivitamins ther. w/minerals Tabs tablet  Take 1 tablet by mouth daily.        Please HPI for pertinent positives, otherwise complete 10 system ROS negative.  Mallampati Score: MD Evaluation Airway: WNL Heart: WNL Abdomen: WNL Chest/ Lungs: WNL ASA  Classification: 1 Mallampati/Airway Score: Two  Physical Exam: BP 114/64 mmHg  Pulse 85  Temp(Src) 98.5 F (36.9 C) (Oral)  Resp 18  Ht '5\' 7"'$  (1.702 m)  Wt 175 lb (79.379 kg)  BMI 27.40 kg/m2  SpO2 96% Body mass index is 27.4 kg/(m^2). General: pleasant, WD, WN white female who is laying in bed in NAD HEENT: head is normocephalic, atraumatic.  Sclera are noninjected.  PERRL.  Ears and nose without any masses or lesions.  Mouth is pink and moist Heart: regular, rate, and rhythm.  Normal s1,s2. No obvious murmurs, gallops, or rubs noted.  Palpable radial and pedal pulses bilaterally Lungs: CTAB, no wheezes, rhonchi, or rales noted.  Respiratory effort nonlabored Abd: soft, tender in LLQ, ND, +BS, no masses, hernias, or organomegaly MS: all 4  extremities are symmetrical with no cyanosis, clubbing, or edema. Psych: A&Ox3 with an appropriate affect.   Labs: Results for orders placed or performed during the hospital encounter of 02/18/16 (from the past 48 hour(s))  Basic metabolic panel     Status: Abnormal   Collection Time: 02/18/16  2:25 PM  Result Value Ref Range   Sodium 136 135 - 145 mmol/L   Potassium 3.5 3.5 - 5.1 mmol/L   Chloride 106 101 - 111 mmol/L   CO2 22 22 - 32 mmol/L   Glucose, Bld 95 65 - 99 mg/dL   BUN 12 6 - 20 mg/dL   Creatinine, Ser 0.71 0.44 - 1.00 mg/dL   Calcium 8.5 (L) 8.9 - 10.3 mg/dL   GFR calc non Af Amer >60 >60 mL/min   GFR calc Af Amer >60 >60 mL/min    Comment: (NOTE) The eGFR has been calculated using the CKD EPI  equation. This calculation has not been validated in all clinical situations. eGFR's persistently <60 mL/min signify possible Chronic Kidney Disease.    Anion gap 8 5 - 15  Protime-INR     Status: Abnormal   Collection Time: 02/18/16  2:25 PM  Result Value Ref Range   Prothrombin Time 15.6 (H) 11.6 - 15.2 seconds   INR 1.22 0.00 - 1.49  CBC with Differential/Platelet     Status: Abnormal   Collection Time: 02/18/16  2:25 PM  Result Value Ref Range   WBC 13.5 (H) 4.0 - 10.5 K/uL   RBC 4.16 3.87 - 5.11 MIL/uL   Hemoglobin 11.4 (L) 12.0 - 15.0 g/dL   HCT 35.5 (L) 36.0 - 46.0 %   MCV 85.3 78.0 - 100.0 fL   MCH 27.4 26.0 - 34.0 pg   MCHC 32.1 30.0 - 36.0 g/dL   RDW 15.7 (H) 11.5 - 15.5 %   Platelets 349 150 - 400 K/uL   Neutrophils Relative % 76 %   Neutro Abs 10.3 (H) 1.7 - 7.7 K/uL   Lymphocytes Relative 15 %   Lymphs Abs 2.0 0.7 - 4.0 K/uL   Monocytes Relative 8 %   Monocytes Absolute 1.1 (H) 0.1 - 1.0 K/uL   Eosinophils Relative 1 %   Eosinophils Absolute 0.1 0.0 - 0.7 K/uL   Basophils Relative 0 %   Basophils Absolute 0.0 0.0 - 0.1 K/uL  CBC     Status: Abnormal   Collection Time: 02/19/16  6:24 AM  Result Value Ref Range   WBC 9.9 4.0 - 10.5 K/uL   RBC 3.84 (L) 3.87 - 5.11 MIL/uL   Hemoglobin 10.6 (L) 12.0 - 15.0 g/dL   HCT 33.1 (L) 36.0 - 46.0 %   MCV 86.2 78.0 - 100.0 fL   MCH 27.6 26.0 - 34.0 pg   MCHC 32.0 30.0 - 36.0 g/dL   RDW 15.7 (H) 11.5 - 15.5 %   Platelets 325 150 - 400 K/uL    Imaging: Ct Abdomen Pelvis W Contrast  02/18/2016  CLINICAL DATA:  55 year old female with left abdominal and pelvic pain for 3 days. EXAM: CT ABDOMEN AND PELVIS WITH CONTRAST TECHNIQUE: Multidetector CT imaging of the abdomen and pelvis was performed using the standard protocol following bolus administration of intravenous contrast. CONTRAST:  164m ISOVUE-300 IOPAMIDOL (ISOVUE-300) INJECTION 61% COMPARISON:  None. FINDINGS: Lower chest:  A moderate hiatal hernia is noted.  Hepatobiliary: The liver is unremarkable. Cholelithiasis identified without CT evidence of acute cholecystitis. There is no evidence of biliary dilatation. Pancreas: Unremarkable Spleen: Unremarkable  Adrenals/Urinary Tract: The kidneys, adrenal glands and bladder are unremarkable. Stomach/Bowel: A 3.7 x 6.6 cm fluid and gas collection within the left pelvis/adnexal region lies adjacent tube thickened sigmoid colon and compatible with an abscess. Adjacent inflammation is noted. Extensive sigmoid colonic diverticulosis noted. There is no evidence of bowel obstruction. The appendix is normal. Vascular/Lymphatic: Abdominal aortic atherosclerotic calcifications noted without aneurysm. No enlarged lymph nodes identified. Reproductive: The uterus is unremarkable. A 3 cm right ovarian cyst is noted. Other: No free fluid. Musculoskeletal: No acute abnormality or suspicious lesions. IMPRESSION: 3.7 x 6.6 cm abscess within the left pelvis/adnexal region, adjacent to thickened sigmoid colon. It is difficult to determine if this represents a tubo-ovarian abscess or abscess originating from the colon/diverticulitis. 3 cm right ovarian cyst. Recommend ultrasound for further characterization. Cholelithiasis without CT evidence of acute cholecystitis. Moderate hiatal hernia. Electronically Signed   By: Margarette Canada M.D.   On: 02/18/2016 12:55    Assessment/Plan 1. Diverticulitis with abscess -we will plan for placement of a percutaneous drain today/tomorrow. -agree with abx therapy -hold DVT prophylaxis for procedure -cont NPO for procedure and secondary to diverticulitis -Risks and Benefits discussed with the patient including bleeding, infection, damage to adjacent structures, bowel perforation/fistula connection, and sepsis. All of the patient's questions were answered, patient is agreeable to proceed. Consent signed and in chart.   Thank you for this interesting consult.  I greatly enjoyed meeting TREVA HUYETT  and look forward to participating in their care.  A copy of this report was sent to the requesting provider on this date.  Electronically Signed: Henreitta Cea 02/19/2016, 9:47 AM   I spent a total of 40 Minutes   in face to face in clinical consultation, greater than 50% of which was counseling/coordinating care for diverticulitis with abscess

## 2016-02-19 NOTE — Consult Note (Signed)
[facility] Consultation [facility]  Referring Provider: Dr. Ramiro Harvestaniel [facility] Primary Care Physician:  Colette RibasGOLDING, JOHN CABOT, MD  Reason for Consultation:  Diverticular abscess  HPI: Kelsey King is a 55 y.o. female whom we've been asked to see for diverticular abscess.  For about two weeks, patient has had persistent but intermittent nagging left lower quadrant abdominal pain.  No improvement with oral antibiotics.  No associated nausea, vomiting, change in bowel habits, blood in stool, loss-of-appetite, unintentional weight loss.  Has had some fevers and chills.  CT showed left lower quadrant abscess with associated diverticulosis, diverticular abscess clinically favored.  She had colonoscopy in February 2011, done elsewhere, which showed moderate left-sided diverticulosis, otherwise unrevealing.   Past Medical History  Diagnosis Date  . Arthritis   . Hypothyroidism     Past Surgical History  Procedure Laterality Date  . Ectopic pregnancy surgery      Prior to Admission medications   Medication Sig Start Date End Date Taking? Authorizing Provider  aspirin EC 81 MG tablet Take 81 mg by mouth daily.   Yes Historical Provider, MD  calcium carbonate (OS-CAL - DOSED IN MG OF ELEMENTAL CALCIUM) 1250 (500 Ca) MG tablet Take 1 tablet by mouth daily.   Yes Historical Provider, MD  ciprofloxacin (CIPRO) 500 MG tablet Take 500 mg by mouth 2 (two) times daily. For 7 days, starting on 02/13/16   Yes Historical Provider, MD  levothyroxine (SYNTHROID, LEVOTHROID) 25 MCG tablet Take 25 mcg by mouth daily before breakfast.   Yes Historical Provider, MD  Multiple Vitamins-Minerals (MULTIVITAMINS THER. W/MINERALS) TABS tablet Take 1 tablet by mouth daily.   Yes Historical Provider, MD    Current Facility-Administered Medications  Medication Dose Route Frequency Provider Last Rate Last Dose  . 0.9 %  sodium chloride infusion  1,000 mL Intravenous Continuous Azalia BilisKevin Campos, MD 125 mL/hr at  02/19/16 0527 1,000 mL at 02/19/16 0527  . acetaminophen (TYLENOL) tablet 650 mg  650 mg Oral Q6H PRN Rhona RaiderJacob J Stinson, DO   650 mg at 02/19/16 0935   Or  . acetaminophen (TYLENOL) suppository 650 mg  650 mg Rectal Q6H PRN Levie HeritageJacob J Stinson, DO      . aspirin EC tablet 81 mg  81 mg Oral Daily Rhona RaiderJacob J Stinson, DO   81 mg at 02/19/16 0934  . HYDROmorphone (DILAUDID) injection 0.5 mg  0.5 mg Intravenous Q2H PRN Rhona RaiderJacob J Stinson, DO      . levothyroxine (SYNTHROID, LEVOTHROID) tablet 25 mcg  25 mcg Oral QAC breakfast Levie HeritageJacob J Stinson, DO   25 mcg at 02/19/16 0935  . ondansetron (ZOFRAN) tablet 4 mg  4 mg Oral Q6H PRN Rhona RaiderJacob J Stinson, DO       Or  . ondansetron [facility](ZOFRAN) injection 4 mg  4 mg Intravenous Q6H PRN Rhona RaiderJacob J Stinson, DO      . piperacillin-tazobactam (ZOSYN) IVPB 3.375 g  3.375 g Intravenous Q8H Azalia BilisKevin Campos, MD 12.5 mL/hr at 02/19/16 0932 3.375 g at 02/19/16 0932    Allergies as of 02/18/2016  . (No Known Allergies)    Family History  Problem Relation Age of Onset  . Breast cancer Maternal Grandmother   . Cancer Father     jaw cancer  . Diverticulosis Mother   . Diabetes Sister     Social History   Social History  . Marital Status: Married    Spouse Name: N/A  . Number of Children: N/A  . Years of Education: N/A  Occupational History  . Not on file.   Social History Main Topics  . Smoking status: Never Smoker   . Smokeless tobacco: Not on file  . Alcohol Use: No  . Drug Use: No  . Sexual Activity: Not on file   Other Topics Concern  . Not on file   Social History Narrative    Review of Systems: As per HPI, all others negative.  Physical Exam: Vital signs in last 24 hours: Temp:  [98.5 F (36.9 C)-99 F (37.2 C)] 98.5 F (36.9 C) (07/14 0500) Pulse Rate:  [85-99] 85 (07/14 0500) Resp:  [16-18] 18 (07/14 0500) BP: (114-158)/(64-100) 114/64 mmHg (07/14 0500) SpO2:  [96 %-100 %] 96 % (07/14 0500) Weight:  [79.379 kg (175 lb)] 79.379 kg (175 lb) (07/13  2007) Last BM Date: 02/18/16 General:   Alert,  Well-developed, overweight, well-nourished, pleasant and cooperative in NAD Head:  Normocephalic and atraumatic. Eyes:  Sclera clear, no icterus.   Conjunctiva pink. Ears:  Normal auditory acuity. Nose:  No deformity, discharge,  or lesions. Mouth:  No deformity or lesions.  Oropharynx pink & moist. Neck:  Supple; no masses or thyromegaly. Lungs:  Clear throughout to auscultation.   No wheezes, crackles, or rhonchi. No acute distress. Heart:  Regular rate and rhythm; no murmurs, clicks, rubs,  or gallops. Abdomen:  Soft, protuberant, mild left lower quadrant tenderness with mild voluntary guarding, no peritonitis, nondistended. No masses, hepatosplenomegaly or hernias noted. Normal bowel sounds, without guarding, and without rebound.     Msk:  Symmetrical without gross deformities. Normal posture. Pulses:  Normal pulses noted. Extremities:  Without clubbing or edema. Neurologic:  Alert and  oriented x4;  grossly normal neurologically. Skin:  Intact without significant lesions or rashes. Cervical Nodes:  No significant cervical adenopathy. Psych:  Alert and cooperative. Normal mood and affect.   Lab Results:  Recent Labs  02/18/16 1425 02/19/16 0624  WBC 13.5* 9.9  HGB 11.4* 10.6*  HCT 35.5* 33.1*  PLT 349 325   BMET  Recent Labs  02/18/16 1425  NA 136  K 3.5  CL 106  CO2 22  GLUCOSE 95  BUN 12  CREATININE 0.71  CALCIUM 8.5*   LFT No results for input(s): PROT, ALBUMIN, AST, ALT, ALKPHOS, BILITOT, BILIDIR, IBILI in the last 72 hours. PT/INR  Recent Labs  02/18/16 1425  LABPROT 15.6*  INR 1.22    Studies/Results: Ct Abdomen Pelvis W Contrast  02/18/2016  CLINICAL DATA:  55 year old female with left abdominal and pelvic pain for 3 days. EXAM: CT ABDOMEN AND PELVIS WITH CONTRAST TECHNIQUE: Multidetector CT imaging of the abdomen and pelvis was performed using the standard protocol following bolus administration of  intravenous contrast. CONTRAST:  ISOVUE-300 IOPAMIDOL (ISOVUE-300) INJECTION 61% COMPARISON:  None. FINDINGS: Lower chest:  A moderate hiatal hernia is noted. Hepatobiliary: The liver is unremarkable. Cholelithiasis identified without CT evidence of acute cholecystitis. There is no evidence of biliary dilatation. Pancreas: Unremarkable Spleen: Unremarkable Adrenals/Urinary Tract: The kidneys, adrenal glands and bladder are unremarkable. Stomach/Bowel: A 3.7 x 6.6 cm fluid and gas collection within the left pelvis/adnexal region lies adjacent tube thickened sigmoid colon and compatible with an abscess. Adjacent inflammation is noted. Extensive sigmoid colonic diverticulosis noted. There is no evidence of bowel obstruction. The appendix is normal. Vascular/Lymphatic: Abdominal aortic atherosclerotic calcifications noted without aneurysm. No enlarged lymph nodes identified. Reproductive: The uterus is unremarkable. A 3 cm right ovarian cyst is noted. Other: No free fluid. Musculoskeletal: No acute abnormality  or suspicious lesions. IMPRESSION: 3.7 x 6.6 cm abscess within the left pelvis/adnexal region, adjacent to thickened sigmoid colon. It is difficult to determine if this represents a tubo-ovarian abscess or abscess originating from the colon/diverticulitis. 3 cm right ovarian cyst. Recommend ultrasound for further characterization. Cholelithiasis without CT evidence of acute cholecystitis. Moderate hiatal hernia. Electronically Signed   By: Harmon Pier M.D.   On: 02/18/2016 12:55   Impression:  1.  Abdominal pain. 2.  Diverticular abscess, noted on CT, in left lower quadrant. 3.  Abnormal abdominal CT scan.  Plan:  1.  Agree with IR-mediated drainage of abscess. 2.  Antibiotics. 3.  Nothing to do from GI perspective. 4.  Defer diet and management decisions to IR team and hospitalists. 5.  Will sign-off; please call with questions; thank you for the consultation.   LOS: 1 day   Zakyia Gagan  M  02/19/2016, 10:40 AM  Pager [phone] If no answer or after 5 PM call [phone]

## 2016-02-20 ENCOUNTER — Inpatient Hospital Stay (HOSPITAL_COMMUNITY): Payer: 59

## 2016-02-20 LAB — BASIC METABOLIC PANEL
ANION GAP: 11 (ref 5–15)
BUN: 5 mg/dL — ABNORMAL LOW (ref 6–20)
CO2: 24 mmol/L (ref 22–32)
Calcium: 8.5 mg/dL — ABNORMAL LOW (ref 8.9–10.3)
Chloride: 105 mmol/L (ref 101–111)
Creatinine, Ser: 0.83 mg/dL (ref 0.44–1.00)
GFR calc non Af Amer: >60 mL/min (ref >60)
GLUCOSE: 87 mg/dL (ref 65–99)
POTASSIUM: 3.4 mmol/L — AB (ref 3.5–5.1)
Sodium: 140 mmol/L (ref 135–145)

## 2016-02-20 LAB — FOLATE: FOLATE: 39.7 ng/mL (ref >5.9)

## 2016-02-20 LAB — CBC WITH DIFFERENTIAL/PLATELET
BASOS ABS: 0 10*3/uL (ref 0.0–0.1)
BASOS PCT: 0 %
Eosinophils Absolute: 0.2 10*3/uL (ref 0.0–0.7)
Eosinophils Relative: 2 %
HEMATOCRIT: 33.9 % — AB (ref 36.0–46.0)
HEMOGLOBIN: 10.8 g/dL — AB (ref 12.0–15.0)
LYMPHS PCT: 14 %
Lymphs Abs: 1.4 10*3/uL (ref 0.7–4.0)
MCH: 27.3 pg (ref 26.0–34.0)
MCHC: 31.9 g/dL (ref 30.0–36.0)
MCV: 85.8 fL (ref 78.0–100.0)
Monocytes Absolute: 0.6 10*3/uL (ref 0.1–1.0)
Monocytes Relative: 6 %
NEUTROS ABS: 8.2 10*3/uL — AB (ref 1.7–7.7)
NEUTROS PCT: 78 %
Platelets: 347 10*3/uL (ref 150–400)
RBC: 3.95 MIL/uL (ref 3.87–5.11)
RDW: 15.4 % (ref 11.5–15.5)
WBC: 10.5 10*3/uL (ref 4.0–10.5)

## 2016-02-20 LAB — IRON AND TIBC
IRON: 14 ug/dL — AB (ref 28–170)
Saturation Ratios: 6 % — ABNORMAL LOW (ref 10.4–31.8)
TIBC: 223 ug/dL — AB (ref 250–450)
UIBC: 209 ug/dL

## 2016-02-20 LAB — VITAMIN B12: Vitamin B-12: 2680 pg/mL — ABNORMAL HIGH (ref 180–914)

## 2016-02-20 LAB — FERRITIN: Ferritin: 121 ng/mL (ref 11–307)

## 2016-02-20 MED ORDER — MIDAZOLAM HCL 2 MG/2ML IJ SOLN
INTRAMUSCULAR | Status: AC
  Filled 2016-02-20: qty 2

## 2016-02-20 MED ORDER — LIDOCAINE-EPINEPHRINE 1 %-1:100000 IJ SOLN
INTRAMUSCULAR | Status: AC
  Filled 2016-02-20: qty 1

## 2016-02-20 MED ORDER — POTASSIUM CHLORIDE CRYS ER 20 MEQ PO TBCR
40.0000 meq | EXTENDED_RELEASE_TABLET | Freq: Once | ORAL | Status: AC
  Administered 2016-02-20: 40 meq via ORAL
  Filled 2016-02-20: qty 2

## 2016-02-20 MED ORDER — FENTANYL CITRATE (PF) 100 MCG/2ML IJ SOLN
INTRAMUSCULAR | Status: AC
  Filled 2016-02-20: qty 2

## 2016-02-20 MED ORDER — MIDAZOLAM HCL 2 MG/2ML IJ SOLN
INTRAMUSCULAR | Status: AC | PRN
  Administered 2016-02-20 (×2): 1 mg via INTRAVENOUS

## 2016-02-20 MED ORDER — SODIUM CHLORIDE 0.9 % IV SOLN
1000.0000 mL | INTRAVENOUS | Status: DC
  Administered 2016-02-21: 1000 mL via INTRAVENOUS

## 2016-02-20 MED ORDER — FENTANYL CITRATE (PF) 100 MCG/2ML IJ SOLN
INTRAMUSCULAR | Status: AC | PRN
  Administered 2016-02-20: 50 ug via INTRAVENOUS
  Administered 2016-02-20 (×2): 25 ug via INTRAVENOUS

## 2016-02-20 NOTE — Sedation Documentation (Signed)
Patient denies pain and is resting comfortably.  

## 2016-02-20 NOTE — Procedures (Signed)
Technically successful CT guided placed of a 12 Fr drainage catheter placement into the left lower abdominal quadrant abscess yielding 40 cc of purulent material.   All aspirated samples sent to the laboratory for analysis.   EBL: None No immediate post procedural complications.   Katherina RightJay Lomax Poehler, MD Pager #: [phone]

## 2016-02-20 NOTE — Progress Notes (Signed)
PROGRESS NOTE    Kelsey King  ZOX:[id number] DOB: 07/16/1961 DOA: 02/18/2016 PCP: Colette RibasGOLDING, JOHN CABOT, MD    Brief Narrative:  Kelsey Pitchnne W Fero is a 55 y.o. 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 Patient seen for nonradiating, moderate left lower quadrant pain 10-14 days it has been worsening. She had the pain in her left lower quadrant for approximately 1 week, then improved for a day or 2, then became worse again. Was seen at an urgent care and treated with ciprofloxacin for UTI approximately 5 days ago, which helped with the pain briefly. When her pain worsen, she went to her primary care doctor, who ordered a CT scan, which she obtained this morning. She was subsequently told to go to the emergency department due to a possible abscess. She has been having slight fever, chills intermittently, night sweats. She denies nausea, vomiting, diarrhea, constipation. She denies blood in her stool or melena.    Assessment & Plan:   Principal Problem:   Abdominal abscess (HCC) Active Problems:   Colonic diverticular abscess   Leukocytosis   Anemia  #1 abdominal abscess/probable diverticular abscess Patient presented with abdominal pain, leukocytosis CT scan with a 3.7 x 6.6 cm abscess within the left pelvis/adnexal region, adjacent to thickened sigmoid colon. Patient currently afebrile. Interventional radiology has been consulted and patient will be scheduled for placement of percutaneous drain tomorrow. Continue empiric IV antibiotics/Zosyn. Supportive care, pain management.  #2 leukocytosis Likely secondary to problem #1. WBC trending down. Continue empiric IV antibiotics.  #3 iron deficiency anemia Patient with no overt bleeding. Anemia panel with a high level of 14 TIBC of 223 with a saturation ratios 6 and a ferritin of 120 and 1 with a folate of 39.7. B-12 levels of 2680. Patient currently  asymptomatic. Will likely need to be on oral iron supplementation on discharge. Follow H&H.  #4 hypokalemia Replete.    DVT prophylaxis: scd Code Status: Full Family Communication: Updated patient no family present. Disposition Plan: Remain inpatient.   Consultants:   IR  GI; Dr Dulce Sellarutlaw 02/19/2016  Procedures:   CT abd/pelvis 02/18/2016  CT-guided placement of percutaneous drainage catheter into left lower quadrant abdominal abscess using 40 mL of purulent material. Per Dr. Grace IsaacWatts 02/20/2016  Antimicrobials:   IV Zosyn 02/18/2016    Subjective: Patient denies any chest. No shortness of breath. No nausea. No emesis. Patient just returned from percutaneous drain placement.  Objective: Filed Vitals:   02/20/16 1050 02/20/16 1135 02/20/16 1140 02/20/16 1204  BP: 142/87 138/83 129/78 130/76  Pulse: 79 85 88 98  Temp:      TempSrc:      Resp: 13 14 17 16   Height:      Weight:      SpO2: 100% 98% 98% 96%    Intake/Output Summary (Last 24 hours) at 02/20/16 1222 Last data filed at 02/20/16 1146  Gross per 24 hour  Intake 1962.92 ml  Output   1540 ml  Net 422.92 ml   Filed Weights   02/18/16 1345 02/18/16 2007  Weight: 79.379 kg (175 lb) 79.379 kg (175 lb)    Examination:  General exam: Appears calm and comfortable  Respiratory system: Clear to auscultation Anterior lung fields. Respiratory effort normal. Cardiovascular system: S1 & S2 heard, RRR. No JVD, murmurs, rubs, gallops or clicks. No pedal edema. Gastrointestinal system: Abdomen is nondistended, soft. No organomegaly or masses felt. Normal bowel sounds heard. LLQ  with percutaneous stranding place with serosanguineous output. Central nervous system: Alert and oriented. No focal neurological deficits. Extremities: Symmetric 5 x 5 power. Skin: No rashes, lesions or ulcers Psychiatry: Judgement and insight appear normal. Mood & affect appropriate.     Data Reviewed: I have personally reviewed following  labs and imaging studies  CBC:  Recent Labs Lab 02/18/16 1425 02/19/16 0624 02/20/16 0628  WBC 13.5* 9.9 10.5  NEUTROABS 10.3*  --  8.2*  HGB 11.4* 10.6* 10.8*  HCT 35.5* 33.1* 33.9*  MCV 85.3 86.2 85.8  PLT 349 325 347   Basic Metabolic Panel:  Recent Labs Lab 02/18/16 1425 02/20/16 0628  NA 136 140  K 3.5 3.4*  CL 106 105  CO2 22 24  GLUCOSE 95 87  BUN 12 5*  CREATININE 0.71 0.83  CALCIUM 8.5* 8.5*   GFR: Estimated Creatinine Clearance: 82.1 mL/min (by C-G formula based on Cr of 0.83). Liver Function Tests: No results for input(s): AST, ALT, ALKPHOS, BILITOT, PROT, ALBUMIN in the last 168 hours. No results for input(s): LIPASE, AMYLASE in the last 168 hours. No results for input(s): AMMONIA in the last 168 hours. Coagulation Profile:  Recent Labs Lab 02/18/16 1425  INR 1.22   Cardiac Enzymes: No results for input(s): CKTOTAL, CKMB, CKMBINDEX, TROPONINI in the last 168 hours. BNP (last 3 results) No results for input(s): PROBNP in the last 8760 hours. HbA1C: No results for input(s): HGBA1C in the last 72 hours. CBG: No results for input(s): GLUCAP in the last 168 hours. Lipid Profile: No results for input(s): CHOL, HDL, LDLCALC, TRIG, CHOLHDL, LDLDIRECT in the last 72 hours. Thyroid Function Tests: No results for input(s): TSH, T4TOTAL, FREET4, T3FREE, THYROIDAB in the last 72 hours. Anemia Panel:  Recent Labs  02/20/16 0628  VITAMINB12 2680*  FOLATE 39.7  FERRITIN 121  TIBC 223*  IRON 14*   Sepsis Labs: No results for input(s): PROCALCITON, LATICACIDVEN in the last 168 hours.  No results found for this or any previous visit (from the past 240 hour(s)).       Radiology Studies: Ct Abdomen Pelvis W Contrast  02/18/2016  CLINICAL DATA:  55 year old female with left abdominal and pelvic pain for 3 days. EXAM: CT ABDOMEN AND PELVIS WITH CONTRAST TECHNIQUE: Multidetector CT imaging of the abdomen and pelvis was performed using the standard  protocol following bolus administration of intravenous contrast. CONTRAST:  ISOVUE-300 IOPAMIDOL (ISOVUE-300) INJECTION 61% COMPARISON:  None. FINDINGS: Lower chest:  A moderate hiatal hernia is noted. Hepatobiliary: The liver is unremarkable. Cholelithiasis identified without CT evidence of acute cholecystitis. There is no evidence of biliary dilatation. Pancreas: Unremarkable Spleen: Unremarkable Adrenals/Urinary Tract: The kidneys, adrenal glands and bladder are unremarkable. Stomach/Bowel: A 3.7 x 6.6 cm fluid and gas collection within the left pelvis/adnexal region lies adjacent tube thickened sigmoid colon and compatible with an abscess. Adjacent inflammation is noted. Extensive sigmoid colonic diverticulosis noted. There is no evidence of bowel obstruction. The appendix is normal. Vascular/Lymphatic: Abdominal aortic atherosclerotic calcifications noted without aneurysm. No enlarged lymph nodes identified. Reproductive: The uterus is unremarkable. A 3 cm right ovarian cyst is noted. Other: No free fluid. Musculoskeletal: No acute abnormality or suspicious lesions. IMPRESSION: 3.7 x 6.6 cm abscess within the left pelvis/adnexal region, adjacent to thickened sigmoid colon. It is difficult to determine if this represents a tubo-ovarian abscess or abscess originating from the colon/diverticulitis. 3 cm right ovarian cyst. Recommend ultrasound for further characterization. Cholelithiasis without CT evidence of acute cholecystitis. Moderate hiatal hernia.  Electronically Signed   By: Harmon Pier M.D.   On: 02/18/2016 12:55        Scheduled Meds: . aspirin EC  81 mg Oral Daily  . fentaNYL      . levothyroxine  25 mcg Oral QAC breakfast  . lidocaine-EPINEPHrine      . midazolam      . piperacillin-tazobactam (ZOSYN)  IV  3.375 g Intravenous Q8H   Continuous Infusions: . sodium chloride 1,000 mL (02/20/16 0830)     LOS: 2 days    Time spent: 35 mins    Lacrystal Barbe, MD [facility] Pager [phone]  If 7PM-7AM, please contact night-coverage [email or web address] Password [facility] 02/20/2016, 12:22 PM

## 2016-02-21 LAB — CBC
HEMATOCRIT: 36 % (ref 36.0–46.0)
HEMOGLOBIN: 11.2 g/dL — AB (ref 12.0–15.0)
MCH: 27 pg (ref 26.0–34.0)
MCHC: 31.1 g/dL (ref 30.0–36.0)
MCV: 86.7 fL (ref 78.0–100.0)
Platelets: 368 10*3/uL (ref 150–400)
RBC: 4.15 MIL/uL (ref 3.87–5.11)
RDW: 15.3 % (ref 11.5–15.5)
WBC: 12.1 10*3/uL — AB (ref 4.0–10.5)

## 2016-02-21 LAB — BASIC METABOLIC PANEL
ANION GAP: 13 (ref 5–15)
BUN: 9 mg/dL (ref 6–20)
CO2: 19 mmol/L — ABNORMAL LOW (ref 22–32)
Calcium: 8.6 mg/dL — ABNORMAL LOW (ref 8.9–10.3)
Chloride: 105 mmol/L (ref 101–111)
Creatinine, Ser: 0.81 mg/dL (ref 0.44–1.00)
GFR calc Af Amer: >60 mL/min (ref >60)
GLUCOSE: 76 mg/dL (ref 65–99)
POTASSIUM: 3.8 mmol/L (ref 3.5–5.1)
SODIUM: 137 mmol/L (ref 135–145)

## 2016-02-21 MED ORDER — SODIUM CHLORIDE 0.9 % IV SOLN
1000.0000 mL | INTRAVENOUS | Status: DC
  Administered 2016-02-21 – 2016-02-23 (×2): 1000 mL via INTRAVENOUS

## 2016-02-21 NOTE — Progress Notes (Signed)
PROGRESS NOTE    Kelsey King  WUJ:[id number] DOB: 02-06-1961 DOA: 02/18/2016 PCP: Colette Ribas, MD    Brief Narrative:  Kelsey King is a 55 y.o. 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 Patient seen for nonradiating, moderate left lower quadrant pain 10-14 days it has been worsening. She had the pain in her left lower quadrant for approximately 1 week, then improved for a day or 2, then became worse again. Was seen at an urgent care and treated with ciprofloxacin for UTI approximately 5 days ago, which helped with the pain briefly. When her pain worsen, she went to her primary care doctor, who ordered a CT scan, which she obtained this morning. She was subsequently told to go to the emergency department due to a possible abscess. She has been having slight fever, chills intermittently, night sweats. She denies nausea, vomiting, diarrhea, constipation. She denies blood in her stool or melena.    Assessment & Plan:   Principal Problem:   Abdominal abscess (HCC) Active Problems:   Colonic diverticular abscess   Leukocytosis   Anemia  #1 abdominal abscess/probable diverticular abscess Patient presented with abdominal pain, leukocytosis CT scan with a 3.7 x 6.6 cm abscess within the left pelvis/adnexal region, adjacent to thickened sigmoid colon. Patient currently afebrile. Interventional radiology has been consulted and patient status post percutaneous drain placement 02/20/2016. Abscess cultures pending. Continue empiric IV antibiotics/Zosyn. Supportive care, pain management.  #2 leukocytosis Likely secondary to problem #1. WBC trending back up. Abscess cultures pending. Continue empiric IV antibiotics.  #3 iron deficiency anemia Patient with no overt bleeding. Anemia panel with a high level of 14 TIBC of 223 with a saturation ratios 6 and a ferritin of 120 and 1 with a folate of 39.7. B-12  levels of 2680. Patient currently asymptomatic. Will likely need to be on oral iron supplementation on discharge. Follow H&H.  #4 hypokalemia Repleted.    DVT prophylaxis: scd Code Status: Full Family Communication: Updated patient and husband at bedside. Disposition Plan: Remain inpatient.   Consultants:   IR  GI; Dr Dulce Sellar 02/19/2016  Procedures:   CT abd/pelvis 02/18/2016  CT-guided placement of percutaneous drainage catheter into left lower quadrant abdominal abscess using 40 mL of purulent material. Per Dr. Grace Isaac 02/20/2016  Antimicrobials:   IV Zosyn 02/18/2016    Subjective: Patient denies any chest. No shortness of breath. No nausea. No emesis. Patient states some improvement with abdominal pain.   Objective: Filed Vitals:   02/20/16 1204 02/20/16 1435 02/20/16 2120 02/21/16 0546  BP: 130/76 135/68 134/75 122/70  Pulse: 98 81 81 80  Temp:  98.2 F (36.8 C) 99.2 F (37.3 C) 98.2 F (36.8 C)  TempSrc:  Oral Oral Oral  Resp: 16 17 16 16   Height:      Weight:      SpO2: 96% 99% 96% 95%    Intake/Output Summary (Last 24 hours) at 02/21/16 1132 Last data filed at 02/21/16 0959  Gross per 24 hour  Intake 3031.67 ml  Output   2490 ml  Net 541.67 ml   Filed Weights   02/18/16 1345 02/18/16 2007  Weight: 79.379 kg (175 lb) 79.379 kg (175 lb)    Examination:  General exam: Appears calm and comfortable  Respiratory system: Clear to auscultation Anterior lung fields. Respiratory effort normal. Cardiovascular system: S1 & S2 heard, RRR. No JVD, murmurs, rubs, gallops or clicks. No pedal edema. Gastrointestinal  system: Abdomen is nondistended, soft. No organomegaly or masses felt. Normal bowel sounds heard. LLQ with percutaneous drain in place with serosanguineous output. Central nervous system: Alert and oriented. No focal neurological deficits. Extremities: Symmetric 5 x 5 power. Skin: No rashes, lesions or ulcers Psychiatry: Judgement and insight appear  normal. Mood & affect appropriate.     Data Reviewed: I have personally reviewed following labs and imaging studies  CBC:  Recent Labs Lab 02/18/16 1425 02/19/16 0624 02/20/16 0628 02/21/16 0635  WBC 13.5* 9.9 10.5 12.1*  NEUTROABS 10.3*  --  8.2*  --   HGB 11.4* 10.6* 10.8* 11.2*  HCT 35.5* 33.1* 33.9* 36.0  MCV 85.3 86.2 85.8 86.7  PLT 349 325 347 368   Basic Metabolic Panel:  Recent Labs Lab 02/18/16 1425 02/20/16 0628 02/21/16 0635  NA 136 140 137  K 3.5 3.4* 3.8  CL 106 105 105  CO2 22 24 19*  GLUCOSE 95 87 76  BUN 12 5* 9  CREATININE 0.71 0.83 0.81  CALCIUM 8.5* 8.5* 8.6*   GFR: Estimated Creatinine Clearance: 84.1 mL/min (by C-G formula based on Cr of 0.81). Liver Function Tests: No results for input(s): AST, ALT, ALKPHOS, BILITOT, PROT, ALBUMIN in the last 168 hours. No results for input(s): LIPASE, AMYLASE in the last 168 hours. No results for input(s): AMMONIA in the last 168 hours. Coagulation Profile:  Recent Labs Lab 02/18/16 1425  INR 1.22   Cardiac Enzymes: No results for input(s): CKTOTAL, CKMB, CKMBINDEX, TROPONINI in the last 168 hours. BNP (last 3 results) No results for input(s): PROBNP in the last 8760 hours. HbA1C: No results for input(s): HGBA1C in the last 72 hours. CBG: No results for input(s): GLUCAP in the last 168 hours. Lipid Profile: No results for input(s): CHOL, HDL, LDLCALC, TRIG, CHOLHDL, LDLDIRECT in the last 72 hours. Thyroid Function Tests: No results for input(s): TSH, T4TOTAL, FREET4, T3FREE, THYROIDAB in the last 72 hours. Anemia Panel:  Recent Labs  02/20/16 0628  VITAMINB12 2680*  FOLATE 39.7  FERRITIN 121  TIBC 223*  IRON 14*   Sepsis Labs: No results for input(s): PROCALCITON, LATICACIDVEN in the last 168 hours.  Recent Results (from the past 240 hour(s))  Aerobic/Anaerobic Culture (surgical/deep wound)     Status: None (Preliminary result)   Collection Time: 02/20/16  4:50 PM  Result Value Ref  Range Status   Specimen Description ABSCESS DRAINAGE  Final   Special Requests POST CT GUIDED DRAIN PLACEMENT INTO LLQ ABSCESS  Final   Gram Stain   Final    ABUNDANT WBC PRESENT, PREDOMINANTLY PMN FEW GRAM NEGATIVE RODS    Culture PENDING  Incomplete   Report Status PENDING  Incomplete         Radiology Studies: Ct Image Guided Drainage By Percutaneous Catheter  02/20/2016  INDICATION: Abscess within the left lower abdominal quadrant / adnexa. Please perform CT-guided drainage catheter placement. EXAM: CT IMAGE GUIDED DRAINAGE BY PERCUTANEOUS CATHETER COMPARISON:  CT abdomen pelvis - 02/18/2016 MEDICATIONS: The patient is currently admitted to the hospital and receiving intravenous antibiotics. The antibiotics were administered within an appropriate time frame prior to the initiation of the procedure. ANESTHESIA/SEDATION: Moderate (conscious) sedation was employed during this procedure. A total of Versed 2 mg and Fentanyl 100 mcg was administered intravenously. Moderate Sedation Time: 11 minutes. The patient's level of consciousness and vital signs were monitored continuously by radiology nursing throughout the procedure under my direct supervision. CONTRAST:  None COMPLICATIONS: None immediate. PROCEDURE: Informed written consent  was obtained from the patient after a discussion of the risks, benefits and alternatives to treatment. The patient was placed supine on the CT gantry and a pre procedural CT was performed re-demonstrating the known abscess/fluid collection within the left lower abdominal quadrant with dominant component measuring 3.6 x 6.4 cm (image 65, series 2). The procedure was planned. A timeout was performed prior to the initiation of the procedure. The skin overlying the anterior aspect of the left lower abdomen/pelvis was prepped and draped in the usual sterile fashion. The overlying soft tissues were anesthetized with 1% lidocaine with epinephrine. Appropriate trajectory was  planned with the use of a 22 gauge spinal needle. An 18 gauge trocar needle was advanced into the abscess/fluid collection and a short Amplatz super stiff wire was coiled within the collection. Appropriate positioning was confirmed with a limited CT scan. The tract was serially dilated allowing placement of a 12 [place] all-purpose drainage catheter. Appropriate positioning was confirmed with a limited postprocedural CT scan. Approximately 40 cc of purulent, foul smelling fluid was aspirated. The tube was connected to a drainage bag and sutured in place. Postprocedural imaging demonstrates near complete evacuation of the left lower abdominal quadrant which appears more associated with the left adnexa as opposed to the adjacent sigmoid colon, though extensive diverticulosis is noted within the adjacent sigmoid colon. A dressing was placed. The patient tolerated the procedure well without immediate post procedural complication. IMPRESSION: Successful CT guided placement of a 109 [place] all purpose drain catheter into the left lower abdominal quadrant/pelvic fluid collection with aspiration of 40 mL of purulent fluid. Samples were sent to the laboratory as requested by the ordering clinical team. Note, it remains uncertain whether this collection represents a tubo-ovarian abscess (favored) versus a diverticular abscess, given extensive diverticular disease within the adjacent sigmoid colon. Electronically Signed   By: Simonne Come M.D.   On: 02/20/2016 12:30        Scheduled Meds: . aspirin EC  81 mg Oral Daily  . levothyroxine  25 mcg Oral QAC breakfast  . piperacillin-tazobactam (ZOSYN)  IV  3.375 g Intravenous Q8H   Continuous Infusions: . sodium chloride 1,000 mL (02/21/16 0506)     LOS: 3 days    Time spent: 35 mins    THOMPSON,DANIEL, MD [facility] Pager [phone]  If 7PM-7AM, please contact night-coverage [email or web address] Password [facility] 02/21/2016, 11:32 AM

## 2016-02-21 NOTE — Progress Notes (Signed)
Referring Physician(s): Dr Candelaria Celeste  Supervising Physician: Simonne Come  Patient Status:  Inpatient  Chief Complaint:  Abdominal abscess Diverticular vs tubo ovarian  Subjective:  Technically successful CT guided placed of a 12 Fr drainage catheter placement into the left lower abdominal quadrant abscess yielding 40 cc of purulent material.  All aspirated samples sent to the laboratory for analysis 02/20/16.  Allergies: Review of patient's allergies indicates no known allergies.  Medications: Prior to Admission medications   Medication Sig Start Date End Date Taking? Authorizing Provider  aspirin EC 81 MG tablet Take 81 mg by mouth daily.   Yes Historical Provider, MD  calcium carbonate (OS-CAL - DOSED IN MG OF ELEMENTAL CALCIUM) 1250 (500 Ca) MG tablet Take 1 tablet by mouth daily.   Yes Historical Provider, MD  ciprofloxacin (CIPRO) 500 MG tablet Take 500 mg by mouth 2 (two) times daily. For 7 days, starting on 02/13/16   Yes Historical Provider, MD  levothyroxine (SYNTHROID, LEVOTHROID) 25 MCG tablet Take 25 mcg by mouth daily before breakfast.   Yes Historical Provider, MD  Multiple Vitamins-Minerals (MULTIVITAMINS THER. W/MINERALS) TABS tablet Take 1 tablet by mouth daily.   Yes Historical Provider, MD     Vital Signs: BP 122/70 mmHg  Pulse 80  Temp(Src) 98.2 F (36.8 C) (Oral)  Resp 16  Ht  (1.702 m)  Wt 175 lb (79.379 kg)  BMI 27.40 kg/m2  SpO2 95%  Physical Exam  Constitutional: She is oriented to person, place, and time.  Abdominal: Soft. Bowel sounds are normal.  Neurological: She is alert and oriented to person, place, and time.  Skin: Skin is warm and dry.  Site of LLQ abscess drain is clean and dry Sl tender Output bloody 10 cc in bag 55 cc output today  Cx pending  Nursing note and vitals reviewed.   Imaging: Ct Abdomen Pelvis W Contrast  02/18/2016  CLINICAL DATA:  55 year old female with left abdominal and pelvic pain for 3  days. EXAM: CT ABDOMEN AND PELVIS WITH CONTRAST TECHNIQUE: Multidetector CT imaging of the abdomen and pelvis was performed using the standard protocol following bolus administration of intravenous contrast. CONTRAST:  ISOVUE-300 IOPAMIDOL (ISOVUE-300) INJECTION 61% COMPARISON:  None. FINDINGS: Lower chest:  A moderate hiatal hernia is noted. Hepatobiliary: The liver is unremarkable. Cholelithiasis identified without CT evidence of acute cholecystitis. There is no evidence of biliary dilatation. Pancreas: Unremarkable Spleen: Unremarkable Adrenals/Urinary Tract: The kidneys, adrenal glands and bladder are unremarkable. Stomach/Bowel: A 3.7 x 6.6 cm fluid and gas collection within the left pelvis/adnexal region lies adjacent tube thickened sigmoid colon and compatible with an abscess. Adjacent inflammation is noted. Extensive sigmoid colonic diverticulosis noted. There is no evidence of bowel obstruction. The appendix is normal. Vascular/Lymphatic: Abdominal aortic atherosclerotic calcifications noted without aneurysm. No enlarged lymph nodes identified. Reproductive: The uterus is unremarkable. A 3 cm right ovarian cyst is noted. Other: No free fluid. Musculoskeletal: No acute abnormality or suspicious lesions. IMPRESSION: 3.7 x 6.6 cm abscess within the left pelvis/adnexal region, adjacent to thickened sigmoid colon. It is difficult to determine if this represents a tubo-ovarian abscess or abscess originating from the colon/diverticulitis. 3 cm right ovarian cyst. Recommend ultrasound for further characterization. Cholelithiasis without CT evidence of acute cholecystitis. Moderate hiatal hernia. Electronically Signed   By: Harmon Pier M.D.   On: 02/18/2016 12:55   Ct Image Guided Drainage By Percutaneous Catheter  02/20/2016  INDICATION: Abscess within the left lower abdominal quadrant / adnexa. Please  perform CT-guided drainage catheter placement. EXAM: CT IMAGE GUIDED DRAINAGE BY PERCUTANEOUS CATHETER  COMPARISON:  CT abdomen pelvis - 02/18/2016 MEDICATIONS: The patient is currently admitted to the hospital and receiving intravenous antibiotics. The antibiotics were administered within an appropriate time frame prior to the initiation of the procedure. ANESTHESIA/SEDATION: Moderate (conscious) sedation was employed during this procedure. A total of Versed 2 mg and Fentanyl 100 mcg was administered intravenously. Moderate Sedation Time: 11 minutes. The patient's level of consciousness and vital signs were monitored continuously by radiology nursing throughout the procedure under my direct supervision. CONTRAST:  None COMPLICATIONS: None immediate. PROCEDURE: Informed written consent was obtained from the patient after a discussion of the risks, benefits and alternatives to treatment. The patient was placed supine on the CT gantry and a pre procedural CT was performed re-demonstrating the known abscess/fluid collection within the left lower abdominal quadrant with dominant component measuring 3.6 x 6.4 cm (image 65, series 2). The procedure was planned. A timeout was performed prior to the initiation of the procedure. The skin overlying the anterior aspect of the left lower abdomen/pelvis was prepped and draped in the usual sterile fashion. The overlying soft tissues were anesthetized with 1% lidocaine with epinephrine. Appropriate trajectory was planned with the use of a 22 gauge spinal needle. An 18 gauge trocar needle was advanced into the abscess/fluid collection and a short Amplatz super stiff wire was coiled within the collection. Appropriate positioning was confirmed with a limited CT scan. The tract was serially dilated allowing placement of a 12 [place] all-purpose drainage catheter. Appropriate positioning was confirmed with a limited postprocedural CT scan. Approximately 40 cc of purulent, foul smelling fluid was aspirated. The tube was connected to a drainage bag and sutured in place. Postprocedural  imaging demonstrates near complete evacuation of the left lower abdominal quadrant which appears more associated with the left adnexa as opposed to the adjacent sigmoid colon, though extensive diverticulosis is noted within the adjacent sigmoid colon. A dressing was placed. The patient tolerated the procedure well without immediate post procedural complication. IMPRESSION: Successful CT guided placement of a 36 French all purpose drain catheter into the left lower abdominal quadrant/pelvic fluid collection with aspiration of 40 mL of purulent fluid. Samples were sent to the laboratory as requested by the ordering clinical team. Note, it remains uncertain whether this collection represents a tubo-ovarian abscess (favored) versus a diverticular abscess, given extensive diverticular disease within the adjacent sigmoid colon. Electronically Signed   By: Simonne Come M.D.   On: 02/20/2016 12:30    Labs:  CBC:  Recent Labs  02/18/16 1425 02/19/16 0624 02/20/16 0628 02/21/16 0635  WBC 13.5* 9.9 10.5 12.1*  HGB 11.4* 10.6* 10.8* 11.2*  HCT 35.5* 33.1* 33.9* 36.0  PLT 349 325 347 368    COAGS:  Recent Labs  02/18/16 1425  INR 1.22    BMP:  Recent Labs  02/18/16 1425 02/20/16 0628 02/21/16 0635  NA 136 140 137  K 3.5 3.4* 3.8  CL 106 105 105  CO2 22 24 19*  GLUCOSE 95 87 76  BUN 12 5* 9  CALCIUM 8.5* 8.5* 8.6*  CREATININE 0.71 0.83 0.81  GFRNONAA >60 >60 >60  GFRAA >60 >60 >60    LIVER FUNCTION TESTS: No results for input(s): BILITOT, AST, ALT, ALKPHOS, PROT, ALBUMIN in the last 8760 hours.  Assessment and Plan:  LLQ abscess drain intact Will follow   Electronically Signed: Livi Mcgann A 02/21/2016, 9:48 AM   I spent a total  of 15 Minutes at the the patient's bedside AND on the patient's hospital floor or unit, greater than 50% of which was counseling/coordinating care for LLQ abscess drain

## 2016-02-22 LAB — BASIC METABOLIC PANEL
Anion gap: 8 (ref 5–15)
BUN: 6 mg/dL (ref 6–20)
CALCIUM: 8.7 mg/dL — AB (ref 8.9–10.3)
CO2: 25 mmol/L (ref 22–32)
CREATININE: 0.71 mg/dL (ref 0.44–1.00)
Chloride: 106 mmol/L (ref 101–111)
GFR calc non Af Amer: >60 mL/min (ref >60)
GLUCOSE: 102 mg/dL — AB (ref 65–99)
Potassium: 3.5 mmol/L (ref 3.5–5.1)
Sodium: 139 mmol/L (ref 135–145)

## 2016-02-22 LAB — CBC
HEMATOCRIT: 37.4 % (ref 36.0–46.0)
Hemoglobin: 11.7 g/dL — ABNORMAL LOW (ref 12.0–15.0)
MCH: 27 pg (ref 26.0–34.0)
MCHC: 31.3 g/dL (ref 30.0–36.0)
MCV: 86.4 fL (ref 78.0–100.0)
Platelets: 422 10*3/uL — ABNORMAL HIGH (ref 150–400)
RBC: 4.33 MIL/uL (ref 3.87–5.11)
RDW: 15.4 % (ref 11.5–15.5)
WBC: 9.1 10*3/uL (ref 4.0–10.5)

## 2016-02-22 MED ORDER — POTASSIUM CHLORIDE CRYS ER 20 MEQ PO TBCR
40.0000 meq | EXTENDED_RELEASE_TABLET | Freq: Once | ORAL | Status: AC
  Administered 2016-02-22: 40 meq via ORAL
  Filled 2016-02-22: qty 2

## 2016-02-22 NOTE — Progress Notes (Signed)
PROGRESS NOTE    Kelsey King  AVW:[id number] DOB: 1961-04-21 DOA: 02/18/2016 PCP: Colette Ribas, MD    Brief Narrative:  Kelsey King is a 55 y.o. 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 Patient seen for nonradiating, moderate left lower quadrant pain 10-14 days it has been worsening. She had the pain in her left lower quadrant for approximately 1 week, then improved for a day or 2, then became worse again. Was seen at an urgent care and treated with ciprofloxacin for UTI approximately 5 days ago, which helped with the pain briefly. When her pain worsen, she went to her primary care doctor, who ordered a CT scan, which she obtained this morning. She was subsequently told to go to the emergency department due to a possible abscess. She has been having slight fever, chills intermittently, night sweats. She denies nausea, vomiting, diarrhea, constipation. She denies blood in her stool or melena.    Assessment & Plan:   Principal Problem:   Abdominal abscess (HCC) Active Problems:   Colonic diverticular abscess   Leukocytosis   Anemia  #1 abdominal abscess/probable diverticular abscess Patient presented with abdominal pain, leukocytosis CT scan with a 3.7 x 6.6 cm abscess within the left pelvis/adnexal region, adjacent to thickened sigmoid colon. Patient currently afebrile. Interventional radiology has been consulted and patient status post percutaneous drain placement 02/20/2016. Abscess cultures pending. Continue empiric IV antibiotics/Zosyn. Supportive care, pain management.  #2 leukocytosis Likely secondary to problem #1. WBC trending down. Abscess cultures pending. Continue empiric IV antibiotics.  #3 iron deficiency anemia Patient with no overt bleeding. Anemia panel with a high level of 14 TIBC of 223 with a saturation ratios 6 and a ferritin of 120 and 1 with a folate of 39.7. B-12  levels of 2680. Patient currently asymptomatic. Will likely need to be on oral iron supplementation on discharge. Follow H&H.  #4 hypokalemia Repleted.    DVT prophylaxis: scd Code Status: Full Family Communication: Updated patient. No family at bedside. Disposition Plan: Home when medically stable and abscess cultures have resulted and per IR.   Consultants:   IR  GI; Dr Dulce Sellar 02/19/2016  Procedures:   CT abd/pelvis 02/18/2016  CT-guided placement of percutaneous drainage catheter into left lower quadrant abdominal abscess using 40 mL of purulent material. Per Dr. Grace Isaac 02/20/2016  Antimicrobials:   IV Zosyn 02/18/2016    Subjective: Patient denies any chest. No shortness of breath. No nausea. No emesis. Patient states improvement with abdominal pain. Patient states she's feeling better. Patient asking for dietary change from a soft diet to a regular diet. Patient states decrease output from percutaneous drain.  Objective: Filed Vitals:   02/21/16 0546 02/21/16 1446 02/21/16 2140 02/22/16 0533  BP: 122/70 119/69 129/75 123/70  Pulse: 80 102 74 72  Temp: 98.2 F (36.8 C) 98.4 F (36.9 C) 99.1 F (37.3 C) 98.7 F (37.1 C)  TempSrc: Oral Oral  Oral  Resp: Height:      Weight:      SpO2: 95% 96% 98% 97%    Intake/Output Summary (Last 24 hours) at 02/22/16 1230 Last data filed at 02/22/16 0930  Gross per 24 hour  Intake    845 ml  Output   1400 ml  Net   -555 ml   Filed Weights   02/18/16 1345 02/18/16 2007  Weight: 79.379 kg (175 lb) 79.379 kg (175 lb)  Examination:  General exam: Appears calm and comfortable  Respiratory system: Clear to auscultation Anterior lung fields. Respiratory effort normal. Cardiovascular system: S1 & S2 heard, RRR. No JVD, murmurs, rubs, gallops or clicks. No pedal edema. Gastrointestinal system: Abdomen is nondistended, soft. No organomegaly or masses felt. Normal bowel sounds heard. LLQ with percutaneous drain  in place with serosanguineous output. Central nervous system: Alert and oriented. No focal neurological deficits. Extremities: Symmetric 5 x 5 power. Skin: No rashes, lesions or ulcers Psychiatry: Judgement and insight appear normal. Mood & affect appropriate.     Data Reviewed: I have personally reviewed following labs and imaging studies  CBC:  Recent Labs Lab 02/18/16 1425 02/19/16 0624 02/20/16 0628 02/21/16 0635 02/22/16 0633  WBC 13.5* 9.9 10.5 12.1* 9.1  NEUTROABS 10.3*  --  8.2*  --   --   HGB 11.4* 10.6* 10.8* 11.2* 11.7*  HCT 35.5* 33.1* 33.9* 36.0 37.4  MCV 85.3 86.2 85.8 86.7 86.4  PLT 349 325 347 368 422*   Basic Metabolic Panel:  Recent Labs Lab 02/18/16 1425 02/20/16 0628 02/21/16 0635 02/22/16 0633  NA 136 140 137 139  K 3.5 3.4* 3.8 3.5  CL 106 105 105 106  CO2 22 24 19* 25  GLUCOSE 95 87 76 102*  BUN 12 5* 9 6  CREATININE 0.71 0.83 0.81 0.71  CALCIUM 8.5* 8.5* 8.6* 8.7*   GFR: Estimated Creatinine Clearance: 85.2 mL/min (by C-G formula based on Cr of 0.71). Liver Function Tests: No results for input(s): AST, ALT, ALKPHOS, BILITOT, PROT, ALBUMIN in the last 168 hours. No results for input(s): LIPASE, AMYLASE in the last 168 hours. No results for input(s): AMMONIA in the last 168 hours. Coagulation Profile:  Recent Labs Lab 02/18/16 1425  INR 1.22   Cardiac Enzymes: No results for input(s): CKTOTAL, CKMB, CKMBINDEX, TROPONINI in the last 168 hours. BNP (last 3 results) No results for input(s): PROBNP in the last 8760 hours. HbA1C: No results for input(s): HGBA1C in the last 72 hours. CBG: No results for input(s): GLUCAP in the last 168 hours. Lipid Profile: No results for input(s): CHOL, HDL, LDLCALC, TRIG, CHOLHDL, LDLDIRECT in the last 72 hours. Thyroid Function Tests: No results for input(s): TSH, T4TOTAL, FREET4, T3FREE, THYROIDAB in the last 72 hours. Anemia Panel:  Recent Labs  02/20/16 0628  VITAMINB12 2680*  FOLATE 39.7   FERRITIN 121  TIBC 223*  IRON 14*   Sepsis Labs: No results for input(s): PROCALCITON, LATICACIDVEN in the last 168 hours.  Recent Results (from the past 240 hour(s))  Aerobic/Anaerobic Culture (surgical/deep wound)     Status: None (Preliminary result)   Collection Time: 02/20/16  4:50 PM  Result Value Ref Range Status   Specimen Description ABSCESS DRAINAGE  Final   Special Requests POST CT GUIDED DRAIN PLACEMENT INTO LLQ ABSCESS  Final   Gram Stain   Final    ABUNDANT WBC PRESENT, PREDOMINANTLY PMN FEW GRAM NEGATIVE RODS    Culture CULTURE REINCUBATED FOR BETTER GROWTH  Final   Report Status PENDING  Incomplete         Radiology Studies: No results found.      Scheduled Meds: . aspirin EC  81 mg Oral Daily  . levothyroxine  25 mcg Oral QAC breakfast  . piperacillin-tazobactam (ZOSYN)  IV  3.375 g Intravenous Q8H   Continuous Infusions: . sodium chloride 1,000 mL (02/21/16 2328)     LOS: 4 days    Time spent: 35 mins  Ramiro HarvestHOMPSON,DANIEL, MD [facility] Pager [phone]  If 7PM-7AM, please contact night-coverage [email or web address] Password [email or web address] 02/22/2016, 12:30 PM

## 2016-02-22 NOTE — Care Management Note (Signed)
Case Management Note  Patient Details  Name: Kelsey King MRN: [id number] Date of Birth: 06/23/1961  Subjective/Objective:                 Patient admitted with Abd Abscess. Patient had straight grain to gravity placed to Left abd on Saturday 7-15. She states that the output has decreased. She states that it gets flushed. She feels it is likely that it may get taken out prior to her going home. She does not feel thatshe would have home needs if the drain gets taken out, but would like a nurse if she goes home with it in place.   Action/Plan:  [facility]   Expected Discharge Date:                  Expected Discharge Plan:  Home/Self Care  In-House Referral:     Discharge planning Services  CM Consult  Post Acute Care Choice:    Choice offered to:     DME Arranged:    DME Agency:     HH Arranged:    HH Agency:     Status of Service:  In process, will continue to follow  If discussed at Long Length of Stay Meetings, dates discussed:    Additional Comments:  Lawerance SabalDebbie Jeraldean Wechter, RN 02/22/2016, 11:36 AM

## 2016-02-22 NOTE — Progress Notes (Signed)
    Reason for visit: Follow up LLQ abscess drain  Subjective: Feeling better today. Started on regular diet, tolerated well so far No N/V, or increased pain  Objective: Physical Exam: BP 135/82 mmHg  Pulse 81  Temp(Src) 98.3 F (36.8 C) (Oral)  Resp 16  Ht 5\' 7"  (1.702 m)  Wt 175 lb (79.379 kg)  BMI 27.40 kg/m2  SpO2 97% Drain intact, site clean, NT Output scant mixed of purulence and blood    Labs: CBC  Recent Labs  02/21/16 0635 02/22/16 0633  WBC 12.1* 9.1  HGB 11.2* 11.7*  HCT 36.0 37.4  PLT 368 422*   BMET  Recent Labs  02/21/16 0635 02/22/16 0633  NA 137 139  K 3.8 3.5  CL 105 106  CO2 19* 25  GLUCOSE 76 102*  BUN 9 6  CREATININE 0.81 0.71  CALCIUM 8.6* 8.7*    Assessment/Plan: Diverticular abscess S/p perc drain 7/15 Stable IR following. Will need follow up CT at IR drain clinic with drain injection after discharge    LOS: 4 days    Brayton ElBRUNING, Kalon Erhardt PA-C 02/22/2016 2:10 PM

## 2016-02-23 LAB — CBC
HCT: 38 % (ref 36.0–46.0)
Hemoglobin: 12 g/dL (ref 12.0–15.0)
MCH: 27.2 pg (ref 26.0–34.0)
MCHC: 31.6 g/dL (ref 30.0–36.0)
MCV: 86.2 fL (ref 78.0–100.0)
PLATELETS: 459 10*3/uL — AB (ref 150–400)
RBC: 4.41 MIL/uL (ref 3.87–5.11)
RDW: 15.6 % — AB (ref 11.5–15.5)
WBC: 9.3 10*3/uL (ref 4.0–10.5)

## 2016-02-23 LAB — BASIC METABOLIC PANEL
ANION GAP: 8 (ref 5–15)
BUN: 5 mg/dL — ABNORMAL LOW (ref 6–20)
CALCIUM: 9 mg/dL (ref 8.9–10.3)
CO2: 25 mmol/L (ref 22–32)
CREATININE: 0.74 mg/dL (ref 0.44–1.00)
Chloride: 108 mmol/L (ref 101–111)
GFR calc non Af Amer: >60 mL/min (ref >60)
Glucose, Bld: 102 mg/dL — ABNORMAL HIGH (ref 65–99)
Potassium: 3.8 mmol/L (ref 3.5–5.1)
SODIUM: 141 mmol/L (ref 135–145)

## 2016-02-23 MED ORDER — FERROUS SULFATE 325 (65 FE) MG PO TABS: 325.0000 mg | ORAL_TABLET | Freq: Three times a day (TID) | ORAL | Status: AC

## 2016-02-23 MED ORDER — AMOXICILLIN-POT CLAVULANATE 875-125 MG PO TABS: 1.0000 | ORAL_TABLET | Freq: Two times a day (BID) | ORAL | Status: AC

## 2016-02-23 MED ORDER — TRAMADOL HCL 50 MG PO TABS: 50.0000 mg | ORAL_TABLET | Freq: Four times a day (QID) | ORAL | Status: DC | PRN

## 2016-02-23 NOTE — Discharge Summary (Signed)
Physician Discharge Summary  Kelsey King:[id number] DOB: 1960/12/09 DOA: 02/18/2016  PCP: Colette Ribas, MD  Admit date: 02/18/2016 Discharge date: 02/23/2016  Time spent: 65 minutes  Recommendations for Outpatient Follow-up:  1. Patient be discharged home. She is to follow-up in the drain clinic early next week for repeat CT scan and possible drain injection. 2. Follow-up with Colette Ribas, MD in 1 week preferably after patient has been seen in the drain clinic. On follow-up if patient's drain remains given after follow-up for drain clinic patient will need to be continued on empiric antibiotics. Patient will be discharged home on 10 more days of oral Augmentin to complete a two-week was of antibiotic treatment. Patient will need a basic metabolic profile done to follow-up on electrolytes and renal function. 3.    Discharge Diagnoses:  Principal Problem:   Abdominal abscess [facility]) Active Problems:   Colonic diverticular abscess   Leukocytosis   Anemia   Discharge Condition: Stable and improved  Diet recommendation: Regular  Filed Weights   02/18/16 1345 02/18/16 2007  Weight: 79.379 kg (175 lb) 79.379 kg (175 lb)    History of present illness:  Per Dr Kelsey King is a 55 y.o. 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 Patient seen for nonradiating, moderate left lower quadrant pain 10-14 days it has been worsening. She had the pain in her left lower quadrant for approximately 1 week, then improved for a day or 2, then became worse again. Was seen at an urgent care and treated with ciprofloxacin for UTI approximately 5 days ago, which helped with the pain briefly. When her pain worsen, she went to her primary care doctor, who ordered a CT scan, which she obtained the morning of admission. She was subsequently told to go to the emergency department due to a possible  abscess. She had been having slight fever, chills intermittently, night sweats. She denied nausea, vomiting, diarrhea, constipation. She denied blood in her stool or melena.  Hospital Course:  #1 abdominal abscess/probable diverticular abscess Patient presented with abdominal pain, leukocytosis, CT scan with a 3.7 x 6.6 cm abscess within the left pelvis/adnexal region, adjacent to thickened sigmoid colon. Patient remained afebrile. Patient was placed empirically on IV Zosyn and GI was consulted. Was recommended per GI to continue empiric IV antibiotics and drain placement. Interventional radiology was consulted and patient underwent percutaneous drain placement 02/20/2016. Abscess cultures were obtained and were pending at time of discharge. Patient was maintained on IV Zosyn throughout the hospitalization be discharged home on 10 more days of oral Augmentin to complete a two-week course of antibiotic treatment. Patient will follow-up in drain clinic early next recurrent for a CT scan and possible joint injection of further evaluation. If during his continued for more than 10 days patient will need prescriptions for further antibiotic treatment with Augmentin.   #2 leukocytosis Likely secondary to problem #1. WBC trended down a leukocytosis had resolved by day of discharge. Patient was maintained on empiric IV antibiotics of Zosyn during the hospitalization. Patient will be discharged oral Augmentin to complete a course of treatment. Abscess cultures pending at time of discharge.  #3 iron deficiency anemia Patient with no overt bleeding. Anemia panel with a high level of 14 TIBC of 223 with a saturation ratios 6 and a ferritin of 120 and 1 with a folate of 39.7. B-12 levels of 2680. Patient currently asymptomatic. Will likely need  to be on oral iron supplementation on discharge. Follow H&H.  #4 hypokalemia Repleted.   Procedures:  CT abd/pelvis 02/18/2016  CT-guided placement of percutaneous  drainage catheter into left lower quadrant abdominal abscess using 40 mL of purulent material. Per Dr. Grace Isaac 02/20/2016  Consultations:  IR  GI; Dr Dulce Sellar 02/19/2016  Discharge Exam: Filed Vitals:   02/23/16 0627 02/23/16 1456  BP: 147/79 147/85  Pulse: 74 90  Temp: 98.2 F (36.8 C)   Resp: 16 18    General: NAD Cardiovascular: RRR Respiratory: CTAB  Discharge Instructions   Discharge Instructions    Diet general    Complete by:  As directed      Discharge instructions    Complete by:  As directed   Follow up in drain clinic early next week. Follow up with Colette Ribas, MD in 1 week after you have been seen at drain clinic.     Increase activity slowly    Complete by:  As directed           Current Discharge Medication List    START taking these medications   Details  amoxicillin-clavulanate (AUGMENTIN) 875-125 MG tablet Take 1 tablet by mouth 2 (two) times daily. Take for 10 days. Qty: 20 tablet, Refills: 0    ferrous sulfate (FERROUSUL) 325 (65 FE) MG tablet Take 1 tablet (325 mg total) by mouth 3 (three) times daily with meals. Refills: 3    traMADol (ULTRAM) 50 MG tablet Take 1-2 tablets (50-100 mg total) by mouth every 6 (six) hours as needed. Qty: 20 tablet, Refills: 0      CONTINUE these medications which have NOT CHANGED   Details  aspirin EC 81 MG tablet Take 81 mg by mouth daily.    calcium carbonate (OS-CAL - DOSED IN MG OF ELEMENTAL CALCIUM) 1250 (500 Ca) MG tablet Take 1 tablet by mouth daily.    levothyroxine (SYNTHROID, LEVOTHROID) 25 MCG tablet Take 25 mcg by mouth daily before breakfast.    Multiple Vitamins-Minerals (MULTIVITAMINS THER. W/MINERALS) TABS tablet Take 1 tablet by mouth daily.      STOP taking these medications     ciprofloxacin (CIPRO) 500 MG tablet        No Known Allergies Follow-up Information    Follow up with Colette Ribas, MD. Schedule an appointment as soon as possible for a visit in 1 week.    Specialty:  Family Medicine   Why:  f/u late next week after you have seen been at drain clinic   Contact information:   [address] [place] [address] [phone]        The results of significant diagnostics from this hospitalization (including imaging, microbiology, ancillary and laboratory) are listed below for reference.    Significant Diagnostic Studies: Ct Abdomen Pelvis W Contrast  02/18/2016  CLINICAL DATA:  55 year old female with left abdominal and pelvic pain for 3 days. EXAM: CT ABDOMEN AND PELVIS WITH CONTRAST TECHNIQUE: Multidetector CT imaging of the abdomen and pelvis was performed using the standard protocol following bolus administration of intravenous contrast. CONTRAST:  ISOVUE-300 IOPAMIDOL (ISOVUE-300) INJECTION 61% COMPARISON:  None. FINDINGS: Lower chest:  A moderate hiatal hernia is noted. Hepatobiliary: The liver is unremarkable. Cholelithiasis identified without CT evidence of acute cholecystitis. There is no evidence of biliary dilatation. Pancreas: Unremarkable Spleen: Unremarkable Adrenals/Urinary Tract: The kidneys, adrenal glands and bladder are unremarkable. Stomach/Bowel: A 3.7 x 6.6 cm fluid and gas collection within the left pelvis/adnexal region lies adjacent  tube thickened sigmoid colon and compatible with an abscess. Adjacent inflammation is noted. Extensive sigmoid colonic diverticulosis noted. There is no evidence of bowel obstruction. The appendix is normal. Vascular/Lymphatic: Abdominal aortic atherosclerotic calcifications noted without aneurysm. No enlarged lymph nodes identified. Reproductive: The uterus is unremarkable. A 3 cm right ovarian cyst is noted. Other: No free fluid. Musculoskeletal: No acute abnormality or suspicious lesions. IMPRESSION: 3.7 x 6.6 cm abscess within the left pelvis/adnexal region, adjacent to thickened sigmoid colon. It is difficult to determine if this represents a tubo-ovarian abscess or abscess originating  from the colon/diverticulitis. 3 cm right ovarian cyst. Recommend ultrasound for further characterization. Cholelithiasis without CT evidence of acute cholecystitis. Moderate hiatal hernia. Electronically Signed   By: Harmon PierJeffrey  Hu M.D.   On: 02/18/2016 12:55   Ct Image Guided Drainage By Percutaneous Catheter  02/20/2016  INDICATION: Abscess within the left lower abdominal quadrant / adnexa. Please perform CT-guided drainage catheter placement. EXAM: CT IMAGE GUIDED DRAINAGE BY PERCUTANEOUS CATHETER COMPARISON:  CT abdomen pelvis - 02/18/2016 MEDICATIONS: The patient is currently admitted to the hospital and receiving intravenous antibiotics. The antibiotics were administered within an appropriate time frame prior to the initiation of the procedure. ANESTHESIA/SEDATION: Moderate (conscious) sedation was employed during this procedure. A total of Versed 2 mg and Fentanyl 100 mcg was administered intravenously. Moderate Sedation Time: 11 minutes. The patient's level of consciousness and vital signs were monitored continuously by radiology nursing throughout the procedure under my direct supervision. CONTRAST:  None COMPLICATIONS: None immediate. PROCEDURE: Informed written consent was obtained from the patient after a discussion of the risks, benefits and alternatives to treatment. The patient was placed supine on the CT gantry and a pre procedural CT was performed re-demonstrating the known abscess/fluid collection within the left lower abdominal quadrant with dominant component measuring 3.6 x 6.4 cm (image 65, series 2). The procedure was planned. A timeout was performed prior to the initiation of the procedure. The skin overlying the anterior aspect of the left lower abdomen/pelvis was prepped and draped in the usual sterile fashion. The overlying soft tissues were anesthetized with 1% lidocaine with epinephrine. Appropriate trajectory was planned with the use of a 22 gauge spinal needle. An 18 gauge trocar  needle was advanced into the abscess/fluid collection and a short Amplatz super stiff wire was coiled within the collection. Appropriate positioning was confirmed with a limited CT scan. The tract was serially dilated allowing placement of a 12 [place] all-purpose drainage catheter. Appropriate positioning was confirmed with a limited postprocedural CT scan. Approximately 40 cc of purulent, foul smelling fluid was aspirated. The tube was connected to a drainage bag and sutured in place. Postprocedural imaging demonstrates near complete evacuation of the left lower abdominal quadrant which appears more associated with the left adnexa as opposed to the adjacent sigmoid colon, though extensive diverticulosis is noted within the adjacent sigmoid colon. A dressing was placed. The patient tolerated the procedure well without immediate post procedural complication. IMPRESSION: Successful CT guided placement of a 6912 French all purpose drain catheter into the left lower abdominal quadrant/pelvic fluid collection with aspiration of 40 mL of purulent fluid. Samples were sent to the laboratory as requested by the ordering clinical team. Note, it remains uncertain whether this collection represents a tubo-ovarian abscess (favored) versus a diverticular abscess, given extensive diverticular disease within the adjacent sigmoid colon. Electronically Signed   By: Simonne ComeJohn  Watts M.D.   On: 02/20/2016 12:30    Microbiology: Recent Results (from the past 240  hour(s))  Aerobic/Anaerobic Culture (surgical/deep wound)     Status: None (Preliminary result)   Collection Time: 02/20/16  4:50 PM  Result Value Ref Range Status   Specimen Description ABSCESS DRAINAGE  Final   Special Requests POST CT GUIDED DRAIN PLACEMENT INTO LLQ ABSCESS  Final   Gram Stain   Final    ABUNDANT WBC PRESENT, PREDOMINANTLY PMN FEW GRAM NEGATIVE RODS    Culture   Final    ABUNDANT GRAM POSITIVE COCCI IDENTIFICATION TO FOLLOW HOLDING FOR POSSIBLE  ANAEROBE    Report Status PENDING  Incomplete     Labs: Basic Metabolic Panel:  Recent Labs Lab 02/18/16 1425 02/20/16 0628 02/21/16 0635 02/22/16 0633 02/23/16 0656  NA 136 140 137 139 141  K 3.5 3.4* 3.8 3.5 3.8  CL 106 105 105 106 108  CO2 22 24 19* 25 25  GLUCOSE 95 87 76 102* 102*  BUN 12 5* 9 6 5*  CREATININE 0.71 0.83 0.81 0.71 0.74  CALCIUM 8.5* 8.5* 8.6* 8.7* 9.0   Liver Function Tests: No results for input(s): AST, ALT, ALKPHOS, BILITOT, PROT, ALBUMIN in the last 168 hours. No results for input(s): LIPASE, AMYLASE in the last 168 hours. No results for input(s): AMMONIA in the last 168 hours. CBC:  Recent Labs Lab 02/18/16 1425 02/19/16 0624 02/20/16 0628 02/21/16 0635 02/22/16 0633 02/23/16 0656  WBC 13.5* 9.9 10.5 12.1* 9.1 9.3  NEUTROABS 10.3*  --  8.2*  --   --   --   HGB 11.4* 10.6* 10.8* 11.2* 11.7* 12.0  HCT 35.5* 33.1* 33.9* 36.0 37.4 38.0  MCV 85.3 86.2 85.8 86.7 86.4 86.2  PLT 349 325 347 368 422* 459*   Cardiac Enzymes: No results for input(s): CKTOTAL, CKMB, CKMBINDEX, TROPONINI in the last 168 hours. BNP: BNP (last 3 results) No results for input(s): BNP in the last 8760 hours.  ProBNP (last 3 results) No results for input(s): PROBNP in the last 8760 hours.  CBG: No results for input(s): GLUCAP in the last 168 hours.     SignedRamiro Harvest MD.  [facility] 02/23/2016, 4:18 PM

## 2016-02-23 NOTE — Progress Notes (Signed)
Patient ID: Kelsey King, female   DOB: 10/21/1960, 55 y.o.   MRN: [id number]    Referring Physician(s): Candelaria CelesteJacob Stinson  Supervising Physician: Gilmer MorWagner, Jaime  Patient Status: inpt  Chief Complaint: Diverticulitis with abscess  Subjective: Patient feeling very well.  She is tolerating a regular diet and having minimal to no pain.  She and her husband have been taught how to flush her drain and she is comfortable with this.  Allergies: Review of patient's allergies indicates no known allergies.  Medications: Prior to Admission medications   Medication Sig Start Date End Date Taking? Authorizing Provider  aspirin EC 81 MG tablet Take 81 mg by mouth daily.   Yes Historical Provider, MD  calcium carbonate (OS-CAL - DOSED IN MG OF ELEMENTAL CALCIUM) 1250 (500 Ca) MG tablet Take 1 tablet by mouth daily.   Yes Historical Provider, MD  ciprofloxacin (CIPRO) 500 MG tablet Take 500 mg by mouth 2 (two) times daily. For 7 days, starting on 02/13/16   Yes Historical Provider, MD  levothyroxine (SYNTHROID, LEVOTHROID) 25 MCG tablet Take 25 mcg by mouth daily before breakfast.   Yes Historical Provider, MD  Multiple Vitamins-Minerals (MULTIVITAMINS THER. W/MINERALS) TABS tablet Take 1 tablet by mouth daily.   Yes Historical Provider, MD    Vital Signs: BP 147/79 mmHg  Pulse 74  Temp(Src) 98.2 F (36.8 C) (Oral)  Resp 16  Ht 5\' 7"  (1.702 m)  Wt 175 lb (79.379 kg)  BMI 27.40 kg/m2  SpO2 99%  Physical Exam: Abd: soft, NT, drain site is c/d/i.  Drain with minimal output.  28cc of serosang output yesterday.  Imaging: Ct Image Guided Drainage By Percutaneous Catheter  02/20/2016  INDICATION: Abscess within the left lower abdominal quadrant / adnexa. Please perform CT-guided drainage catheter placement. EXAM: CT IMAGE GUIDED DRAINAGE BY PERCUTANEOUS CATHETER COMPARISON:  CT abdomen pelvis - 02/18/2016 MEDICATIONS: The patient is currently admitted to the hospital and receiving intravenous  antibiotics. The antibiotics were administered within an appropriate time frame prior to the initiation of the procedure. ANESTHESIA/SEDATION: Moderate (conscious) sedation was employed during this procedure. A total of Versed 2 mg and Fentanyl 100 mcg was administered intravenously. Moderate Sedation Time: 11 minutes. The patient's level of consciousness and vital signs were monitored continuously by radiology nursing throughout the procedure under my direct supervision. CONTRAST:  None COMPLICATIONS: None immediate. PROCEDURE: Informed written consent was obtained from the patient after a discussion of the risks, benefits and alternatives to treatment. The patient was placed supine on the CT gantry and a pre procedural CT was performed re-demonstrating the known abscess/fluid collection within the left lower abdominal quadrant with dominant component measuring 3.6 x 6.4 cm (image 65, series 2). The procedure was planned. A timeout was performed prior to the initiation of the procedure. The skin overlying the anterior aspect of the left lower abdomen/pelvis was prepped and draped in the usual sterile fashion. The overlying soft tissues were anesthetized with 1% lidocaine with epinephrine. Appropriate trajectory was planned with the use of a 22 gauge spinal needle. An 18 gauge trocar needle was advanced into the abscess/fluid collection and a short Amplatz super stiff wire was coiled within the collection. Appropriate positioning was confirmed with a limited CT scan. The tract was serially dilated allowing placement of a 12 [place] all-purpose drainage catheter. Appropriate positioning was confirmed with a limited postprocedural CT scan. Approximately 40 cc of purulent, foul smelling fluid was aspirated. The tube was connected to a drainage bag and sutured in  place. Postprocedural imaging demonstrates near complete evacuation of the left lower abdominal quadrant which appears more associated with the left adnexa as  opposed to the adjacent sigmoid colon, though extensive diverticulosis is noted within the adjacent sigmoid colon. A dressing was placed. The patient tolerated the procedure well without immediate post procedural complication. IMPRESSION: Successful CT guided placement of a 39 French all purpose drain catheter into the left lower abdominal quadrant/pelvic fluid collection with aspiration of 40 mL of purulent fluid. Samples were sent to the laboratory as requested by the ordering clinical team. Note, it remains uncertain whether this collection represents a tubo-ovarian abscess (favored) versus a diverticular abscess, given extensive diverticular disease within the adjacent sigmoid colon. Electronically Signed   By: Simonne Come M.D.   On: 02/20/2016 12:30    Labs:  CBC:  Recent Labs  02/20/16 0628 02/21/16 0635 02/22/16 0633 02/23/16 0656  WBC 10.5 12.1* 9.1 9.3  HGB 10.8* 11.2* 11.7* 12.0  HCT 33.9* 36.0 37.4 38.0  PLT 347 368 422* 459*    COAGS:  Recent Labs  02/18/16 1425  INR 1.22    BMP:  Recent Labs  02/20/16 0628 02/21/16 0635 02/22/16 0633 02/23/16 0656  NA 140 137 139 141  K 3.4* 3.8 3.5 3.8  CL 105 105 106 108  CO2 24 19* 25 25  GLUCOSE 87 76 102* 102*  BUN 5* 9 6 5*  CALCIUM 8.5* 8.6* 8.7* 9.0  CREATININE 0.83 0.81 0.71 0.74  GFRNONAA >60 >60 >60 >60  GFRAA >60 >60 >60 >60    LIVER FUNCTION TESTS: No results for input(s): BILITOT, AST, ALT, ALKPHOS, PROT, ALBUMIN in the last 8760 hours.  Assessment and Plan: 1. Diverticulitis with abscess, s/p perc drain on 7/15 - Watts -continue drain placement for now.  She may be discharged home with her drain.  We will have her follow up in drain clinic early next week for a CT scan and possible drain injection. -CX still pending, but show gram positive cocci  Electronically Signed: Johnell Bas E 02/23/2016, 10:16 AM   I spent a total of 15 Minutes at the the patient's bedside AND on the patient's hospital  floor or unit, greater than 50% of which was counseling/coordinating care for diverticulitis with abscess

## 2016-02-24 ENCOUNTER — Other Ambulatory Visit: Payer: Self-pay | Admitting: Gastroenterology

## 2016-02-24 DIAGNOSIS — K5720 Diverticulitis of large intestine with perforation and abscess without bleeding: Secondary | ICD-10-CM

## 2016-02-25 LAB — AEROBIC/ANAEROBIC CULTURE W GRAM STAIN (SURGICAL/DEEP WOUND)

## 2016-02-25 LAB — AEROBIC/ANAEROBIC CULTURE (SURGICAL/DEEP WOUND)

## 2016-02-26 ENCOUNTER — Ambulatory Visit (HOSPITAL_COMMUNITY): Payer: Self-pay

## 2016-03-02 ENCOUNTER — Ambulatory Visit
Admission: RE | Admit: 2016-03-02 | Discharge: 2016-03-02 | Disposition: A | Discharge status: Home / Self Care | Payer: 59 | Source: Ambulatory Visit | Attending: Gastroenterology | Admitting: Gastroenterology

## 2016-03-02 ENCOUNTER — Other Ambulatory Visit: Payer: Self-pay | Admitting: Gastroenterology

## 2016-03-02 ENCOUNTER — Ambulatory Visit
Admission: RE | Admit: 2016-03-02 | Discharge: 2016-03-02 | Disposition: A | Discharge status: Home / Self Care | Payer: 59 | Source: Ambulatory Visit | Attending: General Surgery | Admitting: General Surgery

## 2016-03-02 DIAGNOSIS — K5720 Diverticulitis of large intestine with perforation and abscess without bleeding: Secondary | ICD-10-CM

## 2016-03-02 HISTORY — PX: IR GENERIC HISTORICAL: IMG1180011

## 2016-03-02 MED ORDER — IOPAMIDOL (ISOVUE-300) INJECTION 61%
100.0000 mL | Freq: Once | INTRAVENOUS | Status: AC | PRN
  Administered 2016-03-02: 100 mL via INTRAVENOUS

## 2016-03-02 NOTE — Progress Notes (Signed)
Patient ID: Kelsey King, female   DOB: 07-May-1961, 55 y.o.   MRN: [id number]    Chief Complaint: Left lower quadrant diverticular abscess. Status post percutaneous drain. Outpatient follow-up.   Referring Physician(s): golding  Patient Status: Outpatient  History of Present Illness: Kelsey King is a 55 y.o. female with a recent episode of acute diverticulitis, complicated by left lower quadrant pelvic abscess. Patient underwent CT-guided percutaneous drain 02/20/2016 at [facility]. She was discharged shortly thereafter on oral antibiotics with the percutaneous drain. She returns for outpatient follow-up with a CT scan. Overall she has been doing very well at home. No interval fevers. She has 3 days left of oral antibiotics. Repeat CT demonstrates resolution of the diverticular abscess as well as the sigmoid diverticulitis. No current acute inflammatory process in the abdomen or pelvis. Drain injection performed demonstrate no significant fistula but a small residual irregular collapse abscess cavity.  Past Medical History:  Diagnosis Date  . Arthritis   . Hypothyroidism     Past Surgical History:  Procedure Laterality Date  . ECTOPIC PREGNANCY SURGERY      Allergies: Review of patient's allergies indicates no known allergies.  Medications: Prior to Admission medications   Medication Sig Start Date End Date Taking? Authorizing Provider  amoxicillin-clavulanate (AUGMENTIN) 875-125 MG tablet Take 1 tablet by mouth 2 (two) times daily. Take for 10 days. 02/23/16 03/04/16  Rodolph Bong, MD  aspirin EC 81 MG tablet Take 81 mg by mouth daily.    Historical Provider, MD  calcium carbonate (OS-CAL - DOSED IN MG OF ELEMENTAL CALCIUM) 1250 (500 Ca) MG tablet Take 1 tablet by mouth daily.    Historical Provider, MD  ferrous sulfate (FERROUSUL) 325 (65 FE) MG tablet Take 1 tablet (325 mg total) by mouth 3 (three) times daily with meals. 02/23/16   Rodolph Bong, MD    levothyroxine (SYNTHROID, LEVOTHROID) 25 MCG tablet Take 25 mcg by mouth daily before breakfast.    Historical Provider, MD  Multiple Vitamins-Minerals (MULTIVITAMINS THER. W/MINERALS) TABS tablet Take 1 tablet by mouth daily.    Historical Provider, MD  traMADol (ULTRAM) 50 MG tablet Take 1-2 tablets (50-100 mg total) by mouth every 6 (six) hours as needed. 02/23/16   Rodolph Bong, MD     Family History  Problem Relation Age of Onset  . Breast cancer Maternal Grandmother   . Cancer Father     jaw cancer  . Diverticulosis Mother   . Diabetes Sister     Social History   Social History  . Marital status: Married    Spouse name: N/A  . Number of children: N/A  . Years of education: N/A   Social History Main Topics  . Smoking status: Never Smoker  . Smokeless tobacco: Not on file  . Alcohol use No  . Drug use: No  . Sexual activity: Not on file   Other Topics Concern  . Not on file   Social History Narrative  . No narrative on file     Review of Systems: A 12 point ROS discussed and pertinent positives are indicated in the HPI above.  All other systems are negative.  Review of Systems  Vital Signs: BP 128/76 (BP Location: Right Arm)   Pulse 79   Temp 98.1 F (36.7 C) (Oral)   SpO2 98%   Physical Exam  Constitutional: She appears well-developed and well-nourished. No distress.  Abdominal: Soft. Bowel sounds are normal. She exhibits no distension. There  is no tenderness. There is no guarding.  Left lower quadrant drain catheter site is clean, dry and intact.  Skin: She is not diaphoretic.     Imaging: Ct Abdomen Pelvis W Contrast  Result Date: 03/02/2016 CLINICAL DATA:  Left lower quadrant diverticular abscess, status post percutaneous drain 02/20/2016 EXAM: CT ABDOMEN AND PELVIS WITH CONTRAST TECHNIQUE: Multidetector CT imaging of the abdomen and pelvis was performed using the standard protocol following bolus administration of intravenous contrast.  CONTRAST:  ISOVUE-300 IOPAMIDOL (ISOVUE-300) INJECTION 61% COMPARISON:  02/18/2016, 02/20/2016 FINDINGS: Lower chest:  No acute findings. Hepatobiliary: No focal hepatic abnormality or biliary dilatation. Patent portal vein. Incidental cholelithiasis noted without gallbladder distention or biliary dilatation. Pancreas: Slight degree of fatty replacement without a focal abnormality or ductal dilatation Spleen: Within normal limits in size.  No focal abnormality. Adrenals/Urinary Tract: No masses identified. No evidence of hydronephrosis. Stomach/Bowel: Moderate hiatal hernia. Negative for bowel obstruction, significant dilatation, ileus, or free air. Scattered colonic diverticulosis. Appendix demonstrates a calcified appendicolith without acute inflammation. Left lower quadrant diverticular abscess drain stable in position. The abscess appears resolved. Sigmoid diverticulitis has also resolved. No current acute inflammatory process. No new fluid collection. Vascular/Lymphatic: Minor atherosclerosis of the aorta. Negative for aneurysm or occlusive process. Retro aortic left renal vein noted, a normal variant. No adenopathy. Reproductive: Uterus normal in size and midline. Right adnexal ovarian cyst measures 3.8 x 3.1 cm, image 73. Other: No inguinal abnormality or hernia.  Intact abdominal wall. Musculoskeletal:  No acute osseous finding. IMPRESSION: Resolved left lower quadrant diverticular abscess following percutaneous drain. Resolved adjacent sigmoid diverticulitis. No new fluid collection, abscess, obstruction or free air. Cholelithiasis Moderate hiatal hernia Aortic atherosclerosis without aneurysm Incidental appendicolith noted. 3.8 x 3.2 cm right ovarian cyst Electronically Signed   By: Judie Petit.  Domonic Kimball M.D.   On: 03/02/2016 09:01  Ct Abdomen Pelvis W Contrast  Result Date: 02/18/2016 CLINICAL DATA:  55 year old female with left abdominal and pelvic pain for 3 days. EXAM: CT ABDOMEN AND PELVIS WITH CONTRAST  TECHNIQUE: Multidetector CT imaging of the abdomen and pelvis was performed using the standard protocol following bolus administration of intravenous contrast. CONTRAST:  ISOVUE-300 IOPAMIDOL (ISOVUE-300) INJECTION 61% COMPARISON:  None. FINDINGS: Lower chest:  A moderate hiatal hernia is noted. Hepatobiliary: The liver is unremarkable. Cholelithiasis identified without CT evidence of acute cholecystitis. There is no evidence of biliary dilatation. Pancreas: Unremarkable Spleen: Unremarkable Adrenals/Urinary Tract: The kidneys, adrenal glands and bladder are unremarkable. Stomach/Bowel: A 3.7 x 6.6 cm fluid and gas collection within the left pelvis/adnexal region lies adjacent tube thickened sigmoid colon and compatible with an abscess. Adjacent inflammation is noted. Extensive sigmoid colonic diverticulosis noted. There is no evidence of bowel obstruction. The appendix is normal. Vascular/Lymphatic: Abdominal aortic atherosclerotic calcifications noted without aneurysm. No enlarged lymph nodes identified. Reproductive: The uterus is unremarkable. A 3 cm right ovarian cyst is noted. Other: No free fluid. Musculoskeletal: No acute abnormality or suspicious lesions. IMPRESSION: 3.7 x 6.6 cm abscess within the left pelvis/adnexal region, adjacent to thickened sigmoid colon. It is difficult to determine if this represents a tubo-ovarian abscess or abscess originating from the colon/diverticulitis. 3 cm right ovarian cyst. Recommend ultrasound for further characterization. Cholelithiasis without CT evidence of acute cholecystitis. Moderate hiatal hernia. Electronically Signed   By: Harmon Pier M.D.   On: 02/18/2016 12:55   Dg Sinus/fist Tube Chk-non Gi  Result Date: 03/02/2016 CLINICAL DATA:  Left lower quadrant diverticular abscess, status post percutaneous drain. EXAM: ABSCESS INJECTION  CONTRAST:  10 cc Omnipaque 300 FLUOROSCOPY TIME:  Radiation Exposure Index (as provided by the fluoroscopic device): 84  dGycm2 If the device does not provide the exposure index: Fluoroscopy Time (in minutes and seconds):  1 minutes 12 seconds Number of Acquired Images:  5 COMPARISON:  03/02/2016 CT FINDINGS: Injection of the existing left lower quadrant abscess drain performed under fluoroscopy. Small irregular collapsed abscess cavity noted at the drain catheter site. No definite fistula communication to the adjacent sigmoid. Leakage at the skin site noted. IMPRESSION: Negative for fistula to the colon. Small residual irregular but collapsed abscess cavity at the drain site. Electronically Signed   By: Judie Petit.  Zurisadai Helminiak M.D.   On: 03/02/2016 09:48  Ct Image Guided Drainage By Percutaneous Catheter  Result Date: 02/20/2016 INDICATION: Abscess within the left lower abdominal quadrant / adnexa. Please perform CT-guided drainage catheter placement. EXAM: CT IMAGE GUIDED DRAINAGE BY PERCUTANEOUS CATHETER COMPARISON:  CT abdomen pelvis - 02/18/2016 MEDICATIONS: The patient is currently admitted to the hospital and receiving intravenous antibiotics. The antibiotics were administered within an appropriate time frame prior to the initiation of the procedure. ANESTHESIA/SEDATION: Moderate (conscious) sedation was employed during this procedure. A total of Versed 2 mg and Fentanyl 100 mcg was administered intravenously. Moderate Sedation Time: 11 minutes. The patient's level of consciousness and vital signs were monitored continuously by radiology nursing throughout the procedure under my direct supervision. CONTRAST:  None COMPLICATIONS: None immediate. PROCEDURE: Informed written consent was obtained from the patient after a discussion of the risks, benefits and alternatives to treatment. The patient was placed supine on the CT gantry and a pre procedural CT was performed re-demonstrating the known abscess/fluid collection within the left lower abdominal quadrant with dominant component measuring 3.6 x 6.4 cm (image 65, series 2). The procedure  was planned. A timeout was performed prior to the initiation of the procedure. The skin overlying the anterior aspect of the left lower abdomen/pelvis was prepped and draped in the usual sterile fashion. The overlying soft tissues were anesthetized with 1% lidocaine with epinephrine. Appropriate trajectory was planned with the use of a 22 gauge spinal needle. An 18 gauge trocar needle was advanced into the abscess/fluid collection and a short Amplatz super stiff wire was coiled within the collection. Appropriate positioning was confirmed with a limited CT scan. The tract was serially dilated allowing placement of a 12 [place] all-purpose drainage catheter. Appropriate positioning was confirmed with a limited postprocedural CT scan. Approximately 40 cc of purulent, foul smelling fluid was aspirated. The tube was connected to a drainage bag and sutured in place. Postprocedural imaging demonstrates near complete evacuation of the left lower abdominal quadrant which appears more associated with the left adnexa as opposed to the adjacent sigmoid colon, though extensive diverticulosis is noted within the adjacent sigmoid colon. A dressing was placed. The patient tolerated the procedure well without immediate post procedural complication. IMPRESSION: Successful CT guided placement of a 22 French all purpose drain catheter into the left lower abdominal quadrant/pelvic fluid collection with aspiration of 40 mL of purulent fluid. Samples were sent to the laboratory as requested by the ordering clinical team. Note, it remains uncertain whether this collection represents a tubo-ovarian abscess (favored) versus a diverticular abscess, given extensive diverticular disease within the adjacent sigmoid colon. Electronically Signed   By: Simonne Come M.D.   On: 02/20/2016 12:30    Labs:  CBC:  Recent Labs  02/20/16 0628 02/21/16 0635 02/22/16 0633 02/23/16 0656  WBC 10.5 12.1* 9.1  9.3  HGB 10.8* 11.2* 11.7* 12.0  HCT  33.9* 36.0 37.4 38.0  PLT 347 368 422* 459*    COAGS:  Recent Labs  02/18/16 1425  INR 1.22    BMP:  Recent Labs  02/20/16 0628 02/21/16 0635 02/22/16 0633 02/23/16 0656  NA 140 137 139 141  K 3.4* 3.8 3.5 3.8  CL 105 105 106 108  CO2 24 19* 25 25  GLUCOSE 87 76 102* 102*  BUN 5* 9 6 5*  CALCIUM 8.5* 8.6* 8.7* 9.0  CREATININE 0.83 0.81 0.71 0.74  GFRNONAA >60 >60 >60 >60  GFRAA >60 >60 >60 >60    LIVER FUNCTION TESTS: No results for input(s): BILITOT, AST, ALT, ALKPHOS, PROT, ALBUMIN in the last 8760 hours.  TUMOR MARKERS: No results for input(s): AFPTM, CEA, CA199, CHROMGRNA in the last 8760 hours.  Assessment and Plan:  Acute diverticulitis with left lower quadrant abscess, status post percutaneous drain 02/20/2016. Outpatient CT confirms resolution of the diverticulitis and the abscess. Drain injection demonstrates no significant fistula but a small irregular collapsed abscess cavity.  Plan: Discontinue drain flushing. Keep to gravity drainage. Follow-up in one week with drain injection only and removal.  Thank you for this interesting consult.  I greatly enjoyed meeting Kelsey King and look forward to participating in their care.  A copy of this report was sent to the requesting provider on this date.  Electronically Signed: Berdine Dance 03/02/2016, 10:08 AM   I spent a total of  30 Minutes   in face to face in clinical consultation, greater than 50% of which was counseling/coordinating care for this patient with diverticular abscess.

## 2016-03-08 DIAGNOSIS — K922 Gastrointestinal hemorrhage, unspecified: Secondary | ICD-10-CM

## 2016-03-08 HISTORY — DX: Gastrointestinal hemorrhage, unspecified: K92.2

## 2016-03-09 ENCOUNTER — Ambulatory Visit
Admission: RE | Admit: 2016-03-09 | Discharge: 2016-03-09 | Disposition: A | Discharge status: Home / Self Care | Payer: 59 | Source: Ambulatory Visit | Attending: Gastroenterology | Admitting: Gastroenterology

## 2016-03-09 DIAGNOSIS — K5720 Diverticulitis of large intestine with perforation and abscess without bleeding: Secondary | ICD-10-CM

## 2016-03-09 HISTORY — PX: IR GENERIC HISTORICAL: IMG1180011

## 2016-03-09 NOTE — Progress Notes (Signed)
Patient ID: Kelsey King, female   DOB: [birth year], 55 y.o.   MRN: [id number]    Chief Complaint: Left lower quadrant diverticular abscess. Status post percutaneous drain. Outpatient follow-up.   Referring Physician(s): golding  Patient Status: Outpatient  History of Present Illness: Kelsey King is a 55 y.o. female with a recent episode of acute diverticulitis, complicated by left lower quadrant pelvic abscess. Patient underwent CT-guided percutaneous drain 02/20/2016 at [facility]. She was discharged shortly thereafter on oral antibiotics with the percutaneous drain. She returns for outpatient follow-up with a CT scan. Overall she has been doing very well at home. No interval fevers. She has 3 days left of oral antibiotics. Repeat CT demonstrates resolution of the diverticular abscess as well as the sigmoid diverticulitis. No current acute inflammatory process in the abdomen or pelvis. Drain injection performed 7/26 demonstrate no significant fistula but a small residual irregular collapse abscess cavity. She returns today for repeat drain injection She has had very scant output from the drain. No fever or new abd pain  Past Medical History:  Diagnosis Date  . Arthritis   . Hypothyroidism     Past Surgical History:  Procedure Laterality Date  . ECTOPIC PREGNANCY SURGERY      Allergies: Review of patient's allergies indicates no known allergies.  Medications: Prior to Admission medications   Medication Sig Start Date End Date Taking? Authorizing Provider  amoxicillin-clavulanate (AUGMENTIN) 875-125 MG tablet Take 1 tablet by mouth 2 (two) times daily. Take for 10 days. 02/23/16 03/04/16  Rodolph Bong, MD  aspirin EC 81 MG tablet Take 81 mg by mouth daily.    Historical Provider, MD  calcium carbonate (OS-CAL - DOSED IN MG OF ELEMENTAL CALCIUM) 1250 (500 Ca) MG tablet Take 1 tablet by mouth daily.    Historical Provider, MD  ferrous sulfate (FERROUSUL) 325 (65  FE) MG tablet Take 1 tablet (325 mg total) by mouth 3 (three) times daily with meals. 02/23/16   Rodolph Bong, MD  levothyroxine (SYNTHROID, LEVOTHROID) 25 MCG tablet Take 25 mcg by mouth daily before breakfast.    Historical Provider, MD  Multiple Vitamins-Minerals (MULTIVITAMINS THER. W/MINERALS) TABS tablet Take 1 tablet by mouth daily.    Historical Provider, MD  traMADol (ULTRAM) 50 MG tablet Take 1-2 tablets (50-100 mg total) by mouth every 6 (six) hours as needed. 02/23/16   Rodolph Bong, MD     Family History  Problem Relation Age of Onset  . Breast cancer Maternal Grandmother   . Cancer Father     jaw cancer  . Diverticulosis Mother   . Diabetes Sister     Social History   Social History  . Marital status: Married    Spouse name: N/A  . Number of children: N/A  . Years of education: N/A   Social History Main Topics  . Smoking status: Never Smoker  . Smokeless tobacco: Not on file  . Alcohol use No  . Drug use: No  . Sexual activity: Not on file   Other Topics Concern  . Not on file   Social History Narrative  . No narrative on file     Review of Systems: A 12 point ROS discussed and pertinent positives are indicated in the HPI above.  All other systems are negative.  Review of Systems  Vital Signs: BP (!) 148/79   Pulse 81   Temp 98 F (36.7 C) (Oral)   SpO2 98%   Physical Exam  Constitutional:  She appears well-developed and well-nourished. No distress.  Abdominal: Soft. Bowel sounds are normal. She exhibits no distension. There is no tenderness. There is no guarding.  Left lower quadrant drain catheter site is clean, dry and intact.  Skin: She is not diaphoretic.      Labs:  CBC:  Recent Labs  02/20/16 0628 02/21/16 0635 02/22/16 0633 02/23/16 0656  WBC 10.5 12.1* 9.1 9.3  HGB 10.8* 11.2* 11.7* 12.0  HCT 33.9* 36.0 37.4 38.0  PLT 347 368 422* 459*    COAGS:  Recent Labs  02/18/16 1425  INR 1.22    BMP:  Recent  Labs  02/20/16 0628 02/21/16 0635 02/22/16 0633 02/23/16 0656  NA 140 137 139 141  K 3.4* 3.8 3.5 3.8  CL 105 105 106 108  CO2 24 19* 25 25  GLUCOSE 87 76 102* 102*  BUN 5* 9 6 5*  CALCIUM 8.5* 8.6* 8.7* 9.0  CREATININE 0.83 0.81 0.71 0.74  GFRNONAA >60 >60 >60 >60  GFRAA >60 >60 >60 >60    LIVER FUNCTION TESTS: No results for input(s): BILITOT, AST, ALT, ALKPHOS, PROT, ALBUMIN in the last 8760 hours.  TUMOR MARKERS: No results for input(s): AFPTM, CEA, CA199, CHROMGRNA in the last 8760 hours.  Assessment and Plan: Acute diverticulitis with left lower quadrant abscess, status post percutaneous drain 02/20/2016. Outpatient CT confirms resolution of the diverticulitis and the abscess.  Repeat drain injection today again demonstrates no significant fistula or residual abscess cavity. Drain was removed without difficulty. She will follow up with her primary team.   Electronically Signed: Brayton El 03/09/2016, 9:05 AM   I spent a total of  20 minutes   in face to face in clinical consultation, greater than 50% of which was counseling/coordinating care for this patient with diverticular abscess.

## 2016-03-28 ENCOUNTER — Observation Stay (HOSPITAL_COMMUNITY)
Admission: EM | Admit: 2016-03-28 | Discharge: 2016-03-30 | Disposition: A | Discharge status: Home / Self Care | Payer: 59 | Attending: Internal Medicine | Admitting: Internal Medicine

## 2016-03-28 ENCOUNTER — Encounter (HOSPITAL_COMMUNITY): Payer: Self-pay

## 2016-03-28 DIAGNOSIS — D62 Acute posthemorrhagic anemia: Principal | ICD-10-CM | POA: Insufficient documentation

## 2016-03-28 DIAGNOSIS — E876 Hypokalemia: Secondary | ICD-10-CM | POA: Diagnosis not present

## 2016-03-28 DIAGNOSIS — R55 Syncope and collapse: Secondary | ICD-10-CM | POA: Insufficient documentation

## 2016-03-28 DIAGNOSIS — K210 Gastro-esophageal reflux disease with esophagitis: Secondary | ICD-10-CM | POA: Insufficient documentation

## 2016-03-28 DIAGNOSIS — Z7982 Long term (current) use of aspirin: Secondary | ICD-10-CM | POA: Diagnosis not present

## 2016-03-28 DIAGNOSIS — I10 Essential (primary) hypertension: Secondary | ICD-10-CM | POA: Diagnosis not present

## 2016-03-28 DIAGNOSIS — E039 Hypothyroidism, unspecified: Secondary | ICD-10-CM | POA: Insufficient documentation

## 2016-03-28 DIAGNOSIS — D500 Iron deficiency anemia secondary to blood loss (chronic): Secondary | ICD-10-CM | POA: Insufficient documentation

## 2016-03-28 DIAGNOSIS — D649 Anemia, unspecified: Secondary | ICD-10-CM

## 2016-03-28 DIAGNOSIS — R195 Other fecal abnormalities: Secondary | ICD-10-CM

## 2016-03-28 DIAGNOSIS — K449 Diaphragmatic hernia without obstruction or gangrene: Secondary | ICD-10-CM | POA: Diagnosis not present

## 2016-03-28 DIAGNOSIS — K922 Gastrointestinal hemorrhage, unspecified: Secondary | ICD-10-CM

## 2016-03-28 HISTORY — DX: Peritoneal abscess: K65.1

## 2016-03-28 HISTORY — DX: Gastrointestinal hemorrhage, unspecified: K92.2

## 2016-03-28 HISTORY — DX: Encounter for other specified aftercare: Z51.89

## 2016-03-28 HISTORY — DX: Family history of other specified conditions: Z84.89

## 2016-03-28 LAB — CBC
HCT: 24.1 % — ABNORMAL LOW (ref 36.0–46.0)
HEMOGLOBIN: 7.4 g/dL — AB (ref 12.0–15.0)
MCH: 28.4 pg (ref 26.0–34.0)
MCHC: 30.7 g/dL (ref 30.0–36.0)
MCV: 92.3 fL (ref 78.0–100.0)
Platelets: 334 10*3/uL (ref 150–400)
RBC: 2.61 MIL/uL — ABNORMAL LOW (ref 3.87–5.11)
RDW: 19.1 % — AB (ref 11.5–15.5)
WBC: 12.7 10*3/uL — ABNORMAL HIGH (ref 4.0–10.5)

## 2016-03-28 LAB — COMPREHENSIVE METABOLIC PANEL
ALK PHOS: 69 U/L (ref 38–126)
ALT: 20 U/L (ref 14–54)
ANION GAP: 6 (ref 5–15)
AST: 18 U/L (ref 15–41)
Albumin: 3.8 g/dL (ref 3.5–5.0)
BUN: 15 mg/dL (ref 6–20)
CALCIUM: 8.7 mg/dL — AB (ref 8.9–10.3)
CO2: 23 mmol/L (ref 22–32)
Chloride: 109 mmol/L (ref 101–111)
Creatinine, Ser: 0.95 mg/dL (ref 0.44–1.00)
GFR calc Af Amer: >60 mL/min (ref >60)
GFR calc non Af Amer: >60 mL/min (ref >60)
GLUCOSE: 105 mg/dL — AB (ref 65–99)
Potassium: 3.4 mmol/L — ABNORMAL LOW (ref 3.5–5.1)
SODIUM: 138 mmol/L (ref 135–145)
Total Bilirubin: 0.6 mg/dL (ref 0.3–1.2)
Total Protein: 6.4 g/dL — ABNORMAL LOW (ref 6.5–8.1)

## 2016-03-28 LAB — ABO/RH: ABO/RH(D): A POS

## 2016-03-28 MED ORDER — SODIUM CHLORIDE 0.9 % IV SOLN
10.0000 mL/h | Freq: Once | INTRAVENOUS | Status: AC
  Administered 2016-03-29: 10 mL/h via INTRAVENOUS

## 2016-03-28 NOTE — ED Triage Notes (Signed)
Pt states low hbg, seen by PCP today, told hbg = 7.4. Pt states recently had bowel surgery. Pt states feels lightheaded when standing. Pt states SOB with exertion.

## 2016-03-28 NOTE — ED Provider Notes (Signed)
TIME SEEN: 11:55 PM  CHIEF COMPLAINT: Syncope, shortness of breath, anemia  HPI: Pt is a 55 y.o. female with history of hypothyroidism who was recently admitted to the hospital for diverticulitis complicated by an abscess that required percutaneous drain placed by interventional radiology on July 15 who presents today with symptomatic anemia. Reports she had a syncopal event on Friday and saw her PCP, Terie PurserSamantha Jackson. At that time she was found to have low hemoglobin. Recheck today and her hemoglobin was 7.3. She was diagnosed with iron deficiency anemia while in the hospital and had a hemoglobin of 11.4 on July 13. She has been on iron since discharge reports she has noticed dark foul-smelling stool. No hematochezia. No hematemesis. No vaginal bleeding. She has never had blood transfusion before. She denies any abdominal pain, fevers, vomiting or diarrhea. Had her percutaneous drain removed on August 2. She does describe shortness of breath worse with exertion. No chest pain. States she feels weak all over, has decreased energy, feels fatigued and lightheaded especially with standing. She had a colonoscopy at age 55 that she reports was unremarkable by Dr. Franky MachoMark Jenkins in [place]. Was seen by Dr. Dulce Sellarutlaw while in the hospital in July and has an appointment to see him on Thursday, August 24.  ROS: See HPI Constitutional: no fever  Eyes: no drainage  ENT: no runny nose   Cardiovascular:  no chest pain  Resp:  SOB  GI: no vomiting GU: no dysuria Integumentary: no rash  Allergy: no hives  Musculoskeletal: no leg swelling  Neurological: no slurred speech ROS otherwise negative  PAST MEDICAL HISTORY/PAST SURGICAL HISTORY:  Past Medical History:  Diagnosis Date  . Arthritis   . Hypothyroidism     MEDICATIONS:  Prior to Admission medications   Medication Sig Start Date End Date Taking? Authorizing Provider  aspirin EC 81 MG tablet Take 81 mg by mouth daily.    Historical Provider, MD   calcium carbonate (OS-CAL - DOSED IN MG OF ELEMENTAL CALCIUM) 1250 (500 Ca) MG tablet Take 1 tablet by mouth daily.    Historical Provider, MD  ferrous sulfate (FERROUSUL) 325 (65 FE) MG tablet Take 1 tablet (325 mg total) by mouth 3 (three) times daily with meals. 02/23/16   Rodolph Bonganiel V Thompson, MD  levothyroxine (SYNTHROID, LEVOTHROID) 25 MCG tablet Take 25 mcg by mouth daily before breakfast.    Historical Provider, MD  Multiple Vitamins-Minerals (MULTIVITAMINS THER. W/MINERALS) TABS tablet Take 1 tablet by mouth daily.    Historical Provider, MD  traMADol (ULTRAM) 50 MG tablet Take 1-2 tablets (50-100 mg total) by mouth every 6 (six) hours as needed. 02/23/16   Rodolph Bonganiel V Thompson, MD    ALLERGIES:  No Known Allergies  SOCIAL HISTORY:  Social History  Substance Use Topics  . Smoking status: Never Smoker  . Smokeless tobacco: Never Used  . Alcohol use No    FAMILY HISTORY: Family History  Problem Relation Age of Onset  . Breast cancer Maternal Grandmother   . Cancer Father     jaw cancer  . Diverticulosis Mother   . Diabetes Sister     EXAM: BP 135/81 (BP Location: Left Arm)   Pulse 88   Temp 98.1 F (36.7 C) (Oral)   Resp 14   SpO2 100%  CONSTITUTIONAL: Alert and oriented and responds appropriately to questions. Well-appearing; well-nourished HEAD: Normocephalic EYES: Conjunctivae clear, PERRL, Conjunctival pallor ENT: normal nose; no rhinorrhea; moist mucous membranes NECK: Supple, no meningismus, no LAD  CARD: Regular  and mildly tachycardic; S1 and S2 appreciated; no murmurs, no clicks, no rubs, no gallops RESP: Normal chest excursion without splinting or tachypnea; breath sounds clear and equal bilaterally; no wheezes, no rhonchi, no rales, no hypoxia or respiratory distress, speaking full sentences ABD/GI: Normal bowel sounds; non-distended; soft, non-tender, no rebound, no guarding, no peritoneal signs RECTAL:  Normal rectal tone, no gross blood, Patient does have dark  stool on exam but it is not tarry, she is guaiac positive, multiple small nonthrombosed nonbleeding external hemorrhoids appreciated, nontender rectal exam, hard stool in the rectal vault but no fecal impaction BACK:  The back appears normal and is non-tender to palpation, there is no CVA tenderness EXT: Normal ROM in all joints; non-tender to palpation; no edema; normal capillary refill; no cyanosis, no calf tenderness or swelling    SKIN: Normal color for age and race; warm; no rash NEURO: Moves all extremities equally, sensation to light touch intact diffusely, cranial nerves II through XII intact PSYCH: The patient's mood and manner are appropriate. Grooming and personal hygiene are appropriate.  MEDICAL DECISION MAKING: Patient here was symptomatically anemia. Have consented for 2 units of packed red blood cells in the emergency department. She is mild to tachycardic but otherwise hemodynamic is stable. Abdominal exam completely benign. She does have some dark stool and exam and is guaiac positive but states she has noticed her stool since starting iron on discharge. This could be the cause of her anemia.  ED PROGRESS: 2:00 AM  Pt's chest x-ray clear, troponin negative. Discussed with Dr. Clyde LundborgNiu with hospital service who agrees in admission. I will place will order for telemetry, observation bed per his request. Patient and family have been updated with this plan.    I reviewed all nursing notes, vitals, pertinent old records, EKGs, labs, imaging (as available).     EKG Interpretation  Date/Time:  Tuesday March 29 2016 00:03:25 EDT Ventricular Rate:  88 PR Interval:    QRS Duration: 88 QT Interval:  393 QTC Calculation: 476 R Axis:   56 Text Interpretation:  Sinus rhythm No old tracing to compare Confirmed by Averill Winters,  DO, Teshia Mahone ([id number]) on 03/29/2016 12:34:42 AM         CRITICAL CARE Performed by: Raelyn NumberWARD, Jadine Brumley N   Total critical care time: 40 minutes  Critical care time was  exclusive of separately billable procedures and treating other patients.  Critical care was necessary to treat or prevent imminent or life-threatening deterioration.  Critical care was time spent personally by me on the following activities: development of treatment plan with patient and/or surrogate as well as nursing, discussions with consultants, evaluation of patient's response to treatment, examination of patient, obtaining history from patient or surrogate, ordering and performing treatments and interventions, ordering and review of laboratory studies, ordering and review of radiographic studies, pulse oximetry and re-evaluation of patient's condition.    Layla MawKristen N Eron Staat, DO 03/29/16 0157

## 2016-03-29 ENCOUNTER — Observation Stay (HOSPITAL_COMMUNITY): Payer: 59 | Admitting: Critical Care Medicine

## 2016-03-29 ENCOUNTER — Emergency Department (HOSPITAL_COMMUNITY): Payer: 59

## 2016-03-29 ENCOUNTER — Encounter (HOSPITAL_COMMUNITY)
Admission: EM | Disposition: A | Discharge status: Home / Self Care | Payer: Self-pay | Source: Home / Self Care | Attending: Emergency Medicine

## 2016-03-29 ENCOUNTER — Encounter (HOSPITAL_COMMUNITY): Payer: Self-pay | Admitting: Internal Medicine

## 2016-03-29 DIAGNOSIS — Z5189 Encounter for other specified aftercare: Secondary | ICD-10-CM

## 2016-03-29 DIAGNOSIS — K922 Gastrointestinal hemorrhage, unspecified: Secondary | ICD-10-CM | POA: Diagnosis present

## 2016-03-29 DIAGNOSIS — E039 Hypothyroidism, unspecified: Secondary | ICD-10-CM | POA: Diagnosis not present

## 2016-03-29 DIAGNOSIS — D649 Anemia, unspecified: Secondary | ICD-10-CM

## 2016-03-29 DIAGNOSIS — E876 Hypokalemia: Secondary | ICD-10-CM | POA: Diagnosis present

## 2016-03-29 HISTORY — DX: Encounter for other specified aftercare: Z51.89

## 2016-03-29 HISTORY — PX: ESOPHAGOGASTRODUODENOSCOPY: SHX5428

## 2016-03-29 LAB — BASIC METABOLIC PANEL
Anion gap: 6 (ref 5–15)
BUN: 8 mg/dL (ref 6–20)
CHLORIDE: 114 mmol/L — AB (ref 101–111)
CO2: 24 mmol/L (ref 22–32)
CREATININE: 0.77 mg/dL (ref 0.44–1.00)
Calcium: 8.4 mg/dL — ABNORMAL LOW (ref 8.9–10.3)
GFR calc Af Amer: >60 mL/min (ref >60)
GFR calc non Af Amer: >60 mL/min (ref >60)
GLUCOSE: 95 mg/dL (ref 65–99)
POTASSIUM: 3.7 mmol/L (ref 3.5–5.1)
Sodium: 144 mmol/L (ref 135–145)

## 2016-03-29 LAB — PREPARE RBC (CROSSMATCH)

## 2016-03-29 LAB — CBC
HCT: 29.7 % — ABNORMAL LOW (ref 36.0–46.0)
HCT: 28.1 % — ABNORMAL LOW (ref 36.0–46.0)
HCT: 28 % — ABNORMAL LOW (ref 36.0–46.0)
HEMATOCRIT: 30.9 % — AB (ref 36.0–46.0)
HEMOGLOBIN: 8.7 g/dL — AB (ref 12.0–15.0)
HEMOGLOBIN: 9.6 g/dL — AB (ref 12.0–15.0)
Hemoglobin: 9.2 g/dL — ABNORMAL LOW (ref 12.0–15.0)
Hemoglobin: 9.3 g/dL — ABNORMAL LOW (ref 12.0–15.0)
MCH: 27.7 pg (ref 26.0–34.0)
MCH: 27.6 pg (ref 26.0–34.0)
MCH: 28.7 pg (ref 26.0–34.0)
MCH: 27.5 pg (ref 26.0–34.0)
MCHC: 31 g/dL (ref 30.0–36.0)
MCHC: 31 g/dL (ref 30.0–36.0)
MCHC: 33.2 g/dL (ref 30.0–36.0)
MCHC: 31.1 g/dL (ref 30.0–36.0)
MCV: 89.5 fL (ref 78.0–100.0)
MCV: 89.2 fL (ref 78.0–100.0)
MCV: 86.4 fL (ref 78.0–100.0)
MCV: 88.5 fL (ref 78.0–100.0)
PLATELETS: 277 10*3/uL (ref 150–400)
PLATELETS: 266 10*3/uL (ref 150–400)
PLATELETS: 292 10*3/uL (ref 150–400)
PLATELETS: 155 10*3/uL (ref 150–400)
RBC: 3.32 MIL/uL — ABNORMAL LOW (ref 3.87–5.11)
RBC: 3.15 MIL/uL — ABNORMAL LOW (ref 3.87–5.11)
RBC: 3.24 MIL/uL — AB (ref 3.87–5.11)
RBC: 3.49 MIL/uL — AB (ref 3.87–5.11)
RDW: 18.3 % — AB (ref 11.5–15.5)
RDW: 18.6 % — AB (ref 11.5–15.5)
RDW: 18.9 % — AB (ref 11.5–15.5)
RDW: 19.2 % — AB (ref 11.5–15.5)
WBC: 8.6 10*3/uL (ref 4.0–10.5)
WBC: 7.8 10*3/uL (ref 4.0–10.5)
WBC: 9.3 10*3/uL (ref 4.0–10.5)
WBC: 9.3 10*3/uL (ref 4.0–10.5)

## 2016-03-29 LAB — PROTIME-INR
INR: 1.1
PROTHROMBIN TIME: 14.3 s (ref 11.4–15.2)

## 2016-03-29 LAB — TSH: TSH: 2.827 u[IU]/mL (ref 0.350–4.500)

## 2016-03-29 LAB — APTT: aPTT: 27 seconds (ref 24–36)

## 2016-03-29 LAB — I-STAT TROPONIN, ED: Troponin i, poc: 0 ng/mL (ref 0.00–0.08)

## 2016-03-29 LAB — POC OCCULT BLOOD, ED: FECAL OCCULT BLD: POSITIVE — AB

## 2016-03-29 SURGERY — EGD (ESOPHAGOGASTRODUODENOSCOPY)
Anesthesia: Monitor Anesthesia Care

## 2016-03-29 MED ORDER — FERROUS SULFATE 325 (65 FE) MG PO TABS: 325.0000 mg | ORAL_TABLET | Freq: Two times a day (BID) | ORAL | Status: DC

## 2016-03-29 MED ORDER — FENTANYL CITRATE (PF) 100 MCG/2ML IJ SOLN: 25.0000 ug | INTRAMUSCULAR | Status: DC | PRN

## 2016-03-29 MED ORDER — BUTAMBEN-TETRACAINE-BENZOCAINE 2-2-14 % EX AERO
INHALATION_SPRAY | CUTANEOUS | Status: DC | PRN
  Administered 2016-03-29: 1 via TOPICAL

## 2016-03-29 MED ORDER — LACTATED RINGERS IV SOLN
INTRAVENOUS | Status: DC
  Administered 2016-03-29: 14:00:00 via INTRAVENOUS

## 2016-03-29 MED ORDER — FERROUS SULFATE 325 (65 FE) MG PO TABS
325.0000 mg | ORAL_TABLET | Freq: Two times a day (BID) | ORAL | Status: DC
  Administered 2016-03-29 – 2016-03-30 (×3): 325 mg via ORAL
  Filled 2016-03-29 (×3): qty 1

## 2016-03-29 MED ORDER — ONDANSETRON HCL 4 MG/2ML IJ SOLN: 4.0000 mg | Freq: Three times a day (TID) | INTRAMUSCULAR | Status: DC | PRN

## 2016-03-29 MED ORDER — LEVOTHYROXINE SODIUM 75 MCG PO TABS
37.5000 ug | ORAL_TABLET | Freq: Every day | ORAL | Status: DC
  Administered 2016-03-29 – 2016-03-30 (×2): 37.5 ug via ORAL
  Filled 2016-03-29 (×2): qty 1

## 2016-03-29 MED ORDER — PROPOFOL 10 MG/ML IV BOLUS
INTRAVENOUS | Status: DC | PRN
  Administered 2016-03-29: 10 mg via INTRAVENOUS
  Administered 2016-03-29: 20 mg via INTRAVENOUS
  Administered 2016-03-29: 10 mg via INTRAVENOUS

## 2016-03-29 MED ORDER — SODIUM CHLORIDE 0.9 % IV SOLN: INTRAVENOUS | Status: DC

## 2016-03-29 MED ORDER — SODIUM CHLORIDE 0.9 % IV BOLUS (SEPSIS)
1000.0000 mL | Freq: Once | INTRAVENOUS | Status: AC
  Administered 2016-03-29: 1000 mL via INTRAVENOUS

## 2016-03-29 MED ORDER — POTASSIUM CHLORIDE 20 MEQ/15ML (10%) PO SOLN
20.0000 meq | Freq: Once | ORAL | Status: AC
  Administered 2016-03-29: 20 meq via ORAL
  Filled 2016-03-29: qty 15

## 2016-03-29 MED ORDER — LEVOTHYROXINE SODIUM 75 MCG PO TABS: 37.5000 ug | ORAL_TABLET | Freq: Every day | ORAL | Status: DC

## 2016-03-29 MED ORDER — CALCIUM CARBONATE ANTACID 500 MG PO CHEW
2.0000 | CHEWABLE_TABLET | Freq: Every day | ORAL | Status: DC
  Administered 2016-03-29: 400 mg via ORAL
  Filled 2016-03-29: qty 2

## 2016-03-29 MED ORDER — PANTOPRAZOLE SODIUM 40 MG IV SOLR
40.0000 mg | Freq: Two times a day (BID) | INTRAVENOUS | Status: DC
  Administered 2016-03-29: 40 mg via INTRAVENOUS
  Filled 2016-03-29: qty 40

## 2016-03-29 MED ORDER — SODIUM CHLORIDE 0.9 % IV SOLN
INTRAVENOUS | Status: DC
  Administered 2016-03-29: 07:00:00 via INTRAVENOUS

## 2016-03-29 MED ORDER — PROMETHAZINE HCL 25 MG/ML IJ SOLN
6.2500 mg | INTRAMUSCULAR | Status: DC | PRN
Start: 2016-03-29 — End: 2016-03-30

## 2016-03-29 MED ORDER — ACETAMINOPHEN 325 MG PO TABS: 650.0000 mg | ORAL_TABLET | Freq: Four times a day (QID) | ORAL | Status: DC | PRN

## 2016-03-29 MED ORDER — PANTOPRAZOLE SODIUM 40 MG PO TBEC
40.0000 mg | DELAYED_RELEASE_TABLET | Freq: Two times a day (BID) | ORAL | Status: DC
  Administered 2016-03-29 – 2016-03-30 (×2): 40 mg via ORAL
  Filled 2016-03-29 (×2): qty 1

## 2016-03-29 MED ORDER — ACETAMINOPHEN 650 MG RE SUPP: 650.0000 mg | Freq: Four times a day (QID) | RECTAL | Status: DC | PRN

## 2016-03-29 MED ORDER — SODIUM CHLORIDE 0.9% FLUSH
3.0000 mL | Freq: Two times a day (BID) | INTRAVENOUS | Status: DC
  Administered 2016-03-29 (×2): 3 mL via INTRAVENOUS

## 2016-03-29 MED ORDER — PROPOFOL 500 MG/50ML IV EMUL
INTRAVENOUS | Status: DC | PRN
  Administered 2016-03-29: 75 ug/kg/min via INTRAVENOUS

## 2016-03-29 NOTE — Progress Notes (Addendum)
PROGRESS NOTE  Kelsey King UJW:[id number] DOB: 12/02/1960 DOA: 03/28/2016 PCP: Colette RibasGOLDING, JOHN CABOT, MD  HPI/Recap of past 24 hours: 55 y.o. female with medical history significant of diverticulosis, abdominal abscess, hypothyroidism, arthritis, who presents with black stool, lightheadedness, shortness breast.  She was found to be anemic with Hb of 7.4 down from 12 on 02/23/16 and is now status post 2 units of blood and asymptomatic.   [name], denies dizziness, chest pain, nausea, hematemesis or any BMs since admission. She tolerated transfusions well.   Assessment/Plan: GIB and symptomatic anemia: Patient had positive FOBT in ER. Hemoglobin responded well to transfusion, she is no longer symptomatic. I think this is likely upper GI bleed, but not active so will hold off on PPI infusion. - continue tele bed for obs - no further transfusion for now - continue NPO - continue NS at 75 mL/hr - IV pantoprazole 40 mg BID, would change to infusion if s/s of active bleed - Zofran IV for nausea - Hold ASA - Avoid NSAIDs and SQ heparin - Monitor closely and follow q6h cbc, transfuse as necessary. - [facility] has been consulted this AM  Syncope: She had one episode of syncope, which is most likely due to orthostatic status secondary to blood loss. Patient denies head injury, refused CT scan of the head -Blood transfusion as above - up with assistance only, consider PT/OT  Hypothyroidism: Last TSH was not on record -Continue home Synthroid -Check TSH  Hypokalemia: K= 3.4 on admission. - Repleted  Code Status: FULL   Family Communication: None present this AM.   Disposition Plan: Likely to home in 24-48 hours pending GI workup.    [facility]   Procedures:  None   Antimicrobials:  None    Objective: Vitals:   03/29/16 0230 03/29/16 0242 03/29/16 0358 03/29/16 0431  BP: 125/67 128/77 132/69 (!) 150/82  Pulse: 74 81 83   Resp: 15  12 18 16   Temp:  98.4 F (36.9 C) 98.1 F (36.7 C) 98.3 F (36.8 C)  TempSrc:  Oral  Oral  SpO2: 100% 100% 100% 100%  Weight:    78.2 kg (172 lb 8 oz)  Height:    5\' 7"  (1.702 m)    Intake/Output Summary (Last 24 hours) at 03/29/16 0844 Last data filed at 03/29/16 0357  Gross per 24 hour  Intake              700 ml  Output                0 ml  Net              700 ml   Filed Weights   03/29/16 0431  Weight: 78.2 kg (172 lb 8 oz)    Exam: General:  Alert, oriented, calm, in no acute distress Eyes: pupils round and reactive to light and accomodation, clear sclerea Neck: supple, no masses, trachea mildline  Cardiovascular: RRR, no murmurs or rubs, no peripheral edema  Respiratory: clear to auscultation bilaterally, no wheezes, no crackles  Abdomen: soft, nontender, nondistended, normal bowel tones heard  Skin: dry, no rashes  Musculoskeletal: no joint effusions, normal range of motion  Psychiatric: appropriate affect, normal speech  Neurologic: extraocular muscles intact, clear speech, moving all extremities with intact sensorium    Data Reviewed: CBC:  Recent Labs Lab 03/28/16 1931 03/29/16 0508  WBC 12.7* 8.6  HGB 7.4* 9.2*  HCT 24.1* 29.7*  MCV 92.3  89.5  PLT 334 277   Basic Metabolic Panel:  Recent Labs Lab 03/28/16 1931  NA 138  K 3.4*  CL 109  CO2 23  GLUCOSE 105*  BUN 15  CREATININE 0.95  CALCIUM 8.7*   GFR: Estimated Creatinine Clearance: 71.2 mL/min (by C-G formula based on SCr of 0.95 mg/dL). Liver Function Tests:  Recent Labs Lab 03/28/16 1931  AST 18  ALT 20  ALKPHOS 69  BILITOT 0.6  PROT 6.4*  ALBUMIN 3.8   No results for input(s): LIPASE, AMYLASE in the last 168 hours. No results for input(s): AMMONIA in the last 168 hours. Coagulation Profile:  Recent Labs Lab 03/29/16 0035  INR 1.10   Cardiac Enzymes: No results for input(s): CKTOTAL, CKMB, CKMBINDEX, TROPONINI in the last 168 hours. BNP (last 3 results) No results  for input(s): PROBNP in the last 8760 hours. HbA1C: No results for input(s): HGBA1C in the last 72 hours. CBG: No results for input(s): GLUCAP in the last 168 hours. Lipid Profile: No results for input(s): CHOL, HDL, LDLCALC, TRIG, CHOLHDL, LDLDIRECT in the last 72 hours. Thyroid Function Tests:  Recent Labs  03/29/16 0508  TSH 2.827   Anemia Panel: No results for input(s): VITAMINB12, FOLATE, FERRITIN, TIBC, IRON, RETICCTPCT in the last 72 hours. Urine analysis: No results found for: COLORURINE, APPEARANCEUR, LABSPEC, PHURINE, GLUCOSEU, HGBUR, BILIRUBINUR, KETONESUR, PROTEINUR, UROBILINOGEN, NITRITE, LEUKOCYTESUR Sepsis Labs: @LABRCNTIP (procalcitonin:4,lacticidven:4)  )No results found for this or any previous visit (from the past 240 hour(s)).    Studies: Dg Chest 2 View  Result Date: 03/29/2016 CLINICAL DATA:  55 y/o  F; shortness of breath and syncope tonight. EXAM: CHEST  2 VIEW COMPARISON:  None. FINDINGS: The heart size and mediastinal contours are within normal limits. Both lungs are clear. Mild degenerative changes of the thoracic spine. IMPRESSION: No active cardiopulmonary disease. Electronically Signed   By: Mitzi HansenLance  Furusawa-Stratton M.D.   On: 03/29/2016 00:29    Scheduled Meds: . calcium carbonate  2 tablet Oral QAC supper  . ferrous sulfate  325 mg Oral BID WC  . levothyroxine  37.5 mcg Oral QAC breakfast  . pantoprazole  40 mg Intravenous Q12H  . sodium chloride flush  3 mL Intravenous Q12H    Continuous Infusions: . sodium chloride 75 mL/hr at 03/29/16 0636     LOS: 0 days   Time spent: [address]  Kelsey Vergie LivingMohammed Ikramullah, MD [facility] Pager [phone]  If 7PM-7AM, please contact night-coverage [email or web address] Password [email or web address] 03/29/2016, 8:44 AM

## 2016-03-29 NOTE — Anesthesia Preprocedure Evaluation (Addendum)
Anesthesia Evaluation  Patient identified by MRN, date of birth, ID band Patient awake    Reviewed: Allergy & Precautions, NPO status , Patient's Chart, lab work & pertinent test results  History of Anesthesia Complications (+) Family history of anesthesia reaction and history of anesthetic complications (Mother with PONV)  Airway Mallampati: II  TM Distance: >3 FB Neck ROM: Full    Dental  (+) Dental Advisory Given   Pulmonary    Pulmonary exam normal breath sounds clear to auscultation       Cardiovascular negative cardio ROS Normal cardiovascular exam Rhythm:Regular Rate:Normal     Neuro/Psych negative neurological ROS  negative psych ROS   GI/Hepatic Neg liver ROS, GIB   Endo/Other  Hypothyroidism   Renal/GU      Musculoskeletal  (+) Arthritis , Osteoarthritis,    Abdominal   Peds  Hematology  (+) Blood dyscrasia, anemia , H/H 8.7/28.1   Anesthesia Other Findings   Reproductive/Obstetrics                            Anesthesia Physical Anesthesia Plan  ASA: II  Anesthesia Plan: MAC   Post-op Pain Management:    Induction: Intravenous  Airway Management Planned: Nasal Cannula  Additional Equipment:   Intra-op Plan:   Post-operative Plan:   Informed Consent: I have reviewed the patients History and Physical, chart, labs and discussed the procedure including the risks, benefits and alternatives for the proposed anesthesia with the patient or authorized representative who has indicated his/her understanding and acceptance.   Dental advisory given  Plan Discussed with: Anesthesiologist and Surgeon  Anesthesia Plan Comments: (Discussed risks/benefits/alternatives to MAC sedation including need for ventilatory support, hypotension, need for conversion to general anesthesia.  All patient questions answered.  Patient/guardian wishes to proceed.)       Anesthesia Quick  Evaluation

## 2016-03-29 NOTE — Anesthesia Procedure Notes (Signed)
Procedure Name: MAC Date/Time: 03/29/2016 1:20 PM Performed by: Glo HerringLEE, Sheena Simonis B Pre-anesthesia Checklist: Patient identified, Emergency Drugs available, Suction available, Patient being monitored and Timeout performed Patient Re-evaluated:Patient Re-evaluated prior to inductionOxygen Delivery Method: Nasal cannula Intubation Type: IV induction Placement Confirmation: positive ETCO2 and breath sounds checked- equal and bilateral Dental Injury: Teeth and Oropharynx as per pre-operative assessment

## 2016-03-29 NOTE — Op Note (Signed)
[facility] Patient Name: Kelsey Lemonsnne Boord Procedure Date : 03/29/2016 MRN: [id number] Attending MD: Tresea MallJames L Geisha Abernathy Dr., MD Date of Birth: 06/12/1961 CSN: [id number] Age: 55 Admit Type: Inpatient Procedure:                Upper GI endoscopy Indications:              Iron deficiency anemia secondary to chronic blood                            loss, Occult blood in stool Providers:                Llana AlimentJames L. Chala Gul Dr., MD, Tomma RakersJennifer Kappus, RN,                            Arlee Muslimhris Chandler Tech., Technician, Glo HerringHeather Lee, CRNA Referring MD:              Medicines:                Propofol per Anesthesia Complications:            No immediate complications. Estimated Blood Loss:     Estimated blood loss was minimal. Procedure:                Pre-Anesthesia Assessment:                           - Prior to the procedure, a History and Physical                            was performed, and patient medications and                            allergies were reviewed. The patient's tolerance of                            previous anesthesia was also reviewed. The risks                            and benefits of the procedure and the sedation                            options and risks were discussed with the patient.                            All questions were answered, and informed consent                            was obtained. Prior Anticoagulants: The patient has                            taken aspirin, last dose was 1 day prior to                            procedure. ASA Grade Assessment: II - A patient  with mild systemic disease. After reviewing the                            risks and benefits, the patient was deemed in                            satisfactory condition to undergo the procedure.                           After obtaining informed consent, the endoscope was                            passed under direct vision. Throughout the                   procedure, the patient's blood pressure, pulse, and                            oxygen saturations were monitored continuously. The                            EG-[id number] ([id number]) scope was introduced through the                            mouth, and advanced to the second part of duodenum.                            The upper GI endoscopy was accomplished without                            difficulty. The patient tolerated the procedure                            well. Scope In: Scope Out: Findings:      Mildly severe esophagitis with bleeding was found at the       gastroesophageal junction.      A small hiatal hernia was present.      The stomach was normal.      The examined duodenum was normal. Impression:               - Mildly severe reflux esophagitis.                           - Small hiatal hernia.                           - Normal stomach.                           - Normal examined duodenum.                           - No specimens collected. Moderate Sedation:      MAC by anesthesia Recommendation:           - Patient has a contact number available for  emergencies. The signs and symptoms of potential                            delayed complications were discussed with the                            patient. Return to normal activities tomorrow.                            Written discharge instructions were provided to the                            patient.                           - Full liquid diet today.                           - Use Protonix (pantoprazole) 40 mg PO daily.                           - Return patient to hospital ward for ongoing care.                           - Continue present medications. Procedure Code(s):        --- Professional ---                           [phone], Esophagogastroduodenoscopy, flexible,                            transoral; diagnostic, including collection of                             specimen(s) by brushing or washing, when performed                            (separate procedure) Diagnosis Code(s):        --- Professional ---                           K21.0, Gastro-esophageal reflux disease with                            esophagitis                           K44.9, Diaphragmatic hernia without obstruction or                            gangrene                           D50.0, Iron deficiency anemia secondary to blood                            loss (chronic)  R19.5, Other fecal abnormalities CPT copyright 2016 [employer]. All rights reserved. The codes documented in this report are preliminary and upon coder review may  be revised to meet current compliance requirements. Tresea MallJames L Lee Kalt Dr., MD 03/29/2016 2:47:16 PM This report has been signed electronically. Number of Addenda: 0

## 2016-03-29 NOTE — H&P (Signed)
History and Physical    Kelsey King ZOX:[id number] DOB: 04/05/1961 DOA: 03/28/2016  Referring MD/NP/PA:   PCP: Colette RibasGOLDING, JOHN CABOT, MD   Patient coming from:  The patient is coming from home.  At baseline, pt is independent for most of ADL.   Chief Complaint: Black stool, lightheadedness, shortness of breath  HPI: Kelsey King is a 55 y.o. female with medical history significant of diverticulosis, abdominal abscess, hypothyroidism, arthritis, who presents with black stool, lightheadedness, shortness breast.  Patient was recently hospitalized from 7/13-7/18 due to abdominal abscess. She underwent underwent percutaneous drain placement by IR 02/20/2016. She states that she recovered well from previous abscess drainage. No abdominal pain, fever or chills. The repeated Ct-abd/pelvis showed resolved left lower quadrant diverticular abscess following percutaneous drain; resolved adjacent sigmoid diverticulitis; and no new fluid collection, abscess, obstruction or free air.  Pt states that has been having black stool ever since she was discharged home. Since she is iron supplements, she's not sure if her black stool is due to bleeding or iron supplement. Patient states that in the past 4 days, she has been feeling lightheaded. She also has shortness of breath on exertion. She had one episode of syncope on Friday. The syncope episode happened when she was in the bathroom, lasted for less than 1 minute. She denies any head injury. Patient denies chest pain, cough, fever, chills, nausea, vomiting, diarrhea, abdominal pain. No unilateral weakness, vision change or hearing loss.  ED Course: pt was found to have positive FOBT, hgb dropped from 12.0 on 02/23/16-->7.4 today, WBC 12.7, temperature 99.1, transient tachycardia, INR 1.01, PTT 27, potassium 3.4, creatinine normal, negative chest x-ray for acute abnormalities. Patient is placed on telemetry bed for observation.  Review of Systems:   General:  no fevers, chills, no changes in body weight, has fatigue and lightheadedness  HEENT: no blurry vision, hearing changes or sore throat Respiratory: has dyspnea, no coughing, wheezing CV: no chest pain, no palpitations GI: no nausea, vomiting, abdominal pain, diarrhea, constipation. Has black stool. GU: no dysuria, burning on urination, increased urinary frequency, hematuria  Ext: no leg edema Neuro: no unilateral weakness, numbness, or tingling, no vision change or hearing loss Skin: no rash MSK: No muscle spasm, no deformity, no limitation of range of movement in spin Heme: No easy bruising.  Travel history: No recent long distant travel.  Allergy: No Known Allergies  Past Medical History:  Diagnosis Date  . Arthritis   . Hypothyroidism     Past Surgical History:  Procedure Laterality Date  . ECTOPIC PREGNANCY SURGERY      Social History:  reports that she has never smoked. She has never used smokeless tobacco. She reports that she does not drink alcohol or use drugs.  Family History:  Family History  Problem Relation Age of Onset  . Breast cancer Maternal Grandmother   . Cancer Father     jaw cancer  . Diverticulosis Mother   . Diabetes Sister      Prior to Admission medications   Medication Sig Start Date End Date Taking? Authorizing Provider  aspirin EC 81 MG tablet Take 81 mg by mouth daily.   Yes Historical Provider, MD  calcium carbonate (TUMS - DOSED IN MG ELEMENTAL CALCIUM) 500 MG chewable tablet Chew 2 tablets by mouth daily.   Yes Historical Provider, MD  ferrous sulfate (FERROUSUL) 325 (65 FE) MG tablet Take 1 tablet (325 mg total) by mouth 3 (three) times daily with meals. Patient taking differently:  Take 325 mg by mouth 2 (two) times daily.  02/23/16  Yes Rodolph Bonganiel V Thompson, MD  levothyroxine (SYNTHROID, LEVOTHROID) 25 MCG tablet Take 37.5 mcg by mouth daily before breakfast.    Yes Historical Provider, MD    Physical Exam: Vitals:   03/29/16 0230 03/29/16  0242 03/29/16 0358 03/29/16 0431  BP: 125/67 128/77 132/69 (!) 150/82  Pulse: 74 81 83   Resp: 15 12 18 16   Temp:  98.4 F (36.9 C) 98.1 F (36.7 C) 98.3 F (36.8 C)  TempSrc:  Oral  Oral  SpO2: 100% 100% 100% 100%  Weight:    78.2 kg (172 lb 8 oz)  Height:    5\' 7"  (1.702 m)   General: Not in acute distress. Pale looking. HEENT:       Eyes: PERRL, EOMI, no scleral icterus.       ENT: No discharge from the ears and nose, no pharynx injection, no tonsillar enlargement.        Neck: No JVD, no bruit, no mass felt. Heme: No neck lymph node enlargement. Cardiac: S1/S2, RRR, No murmurs, No gallops or rubs. Respiratory:  No rales, wheezing, rhonchi or rubs. GI: Soft, nondistended, nontender, no rebound pain, no organomegaly, BS present. GU: No hematuria Ext: No pitting leg edema bilaterally. 2+DP/PT pulse bilaterally. Musculoskeletal: No joint deformities, No joint redness or warmth, no limitation of ROM in spin. Skin: No rashes.  Neuro: Alert, oriented X3, cranial nerves II-XII grossly intact, moves all extremities normally. Muscle strength 5/5 in all extremities, sensation to light touch intact. Knee reflex 1+ bilaterally. Negative Babinski's sign. Psych: Patient is not psychotic, no suicidal or hemocidal ideation.  Labs on Admission: I have personally reviewed following labs and imaging studies  CBC:  Recent Labs Lab 03/28/16 1931 03/29/16 0508  WBC 12.7* 8.6  HGB 7.4* 9.2*  HCT 24.1* 29.7*  MCV 92.3 89.5  PLT 334 277   Basic Metabolic Panel:  Recent Labs Lab 03/28/16 1931  NA 138  K 3.4*  CL 109  CO2 23  GLUCOSE 105*  BUN 15  CREATININE 0.95  CALCIUM 8.7*   GFR: Estimated Creatinine Clearance: 71.2 mL/min (by C-G formula based on SCr of 0.95 mg/dL). Liver Function Tests:  Recent Labs Lab 03/28/16 1931  AST 18  ALT 20  ALKPHOS 69  BILITOT 0.6  PROT 6.4*  ALBUMIN 3.8   No results for input(s): LIPASE, AMYLASE in the last 168 hours. No results for  input(s): AMMONIA in the last 168 hours. Coagulation Profile:  Recent Labs Lab 03/29/16 0035  INR 1.10   Cardiac Enzymes: No results for input(s): CKTOTAL, CKMB, CKMBINDEX, TROPONINI in the last 168 hours. BNP (last 3 results) No results for input(s): PROBNP in the last 8760 hours. HbA1C: No results for input(s): HGBA1C in the last 72 hours. CBG: No results for input(s): GLUCAP in the last 168 hours. Lipid Profile: No results for input(s): CHOL, HDL, LDLCALC, TRIG, CHOLHDL, LDLDIRECT in the last 72 hours. Thyroid Function Tests: No results for input(s): TSH, T4TOTAL, FREET4, T3FREE, THYROIDAB in the last 72 hours. Anemia Panel: No results for input(s): VITAMINB12, FOLATE, FERRITIN, TIBC, IRON, RETICCTPCT in the last 72 hours. Urine analysis: No results found for: COLORURINE, APPEARANCEUR, LABSPEC, PHURINE, GLUCOSEU, HGBUR, BILIRUBINUR, KETONESUR, PROTEINUR, UROBILINOGEN, NITRITE, LEUKOCYTESUR Sepsis Labs: @LABRCNTIP (procalcitonin:4,lacticidven:4) )No results found for this or any previous visit (from the past 240 hour(s)).   Radiological Exams on Admission: Dg Chest 2 View  Result Date: 03/29/2016 CLINICAL DATA:  55 y/o  F; shortness of breath and syncope tonight. EXAM: CHEST  2 VIEW COMPARISON:  None. FINDINGS: The heart size and mediastinal contours are within normal limits. Both lungs are clear. Mild degenerative changes of the thoracic spine. IMPRESSION: No active cardiopulmonary disease. Electronically Signed   By: Mitzi Hansen M.D.   On: 03/29/2016 00:29     EKG: Independently reviewed. Sinus rhythm, QTC 476, nonspecific T-wave changes, poor R-wave progression.  Assessment/Plan Principal Problem:   GIB (gastrointestinal bleeding) Active Problems:   Symptomatic anemia   Hypothyroidism   Hypokalemia   GIB and symptomatic anemia: Patient has positive FOBT. Hemoglobin dropped from 12-->7.4. She is symptomatic with lightheadedness and shortness on exertion.   Pt had colonoscopy on 09/24/09 by Dr. Lovell Sheehan, which showed moderate diverticulosis in the sigmoid and descending colon. Her GI bleeding is likely due to lower GI bleeding from diverticulosis.  - will place on tele bed for obs - transfuse with 2 units of blood - NPO - IVF: 1L NS, then 75 mL/hr - Start IV pantoprazole 40 mg bib - Zofran IV for nausea - Hold ASA - Avoid NSAIDs and SQ heparin - Maintain IV access (2 large bore IVs if possible). - Monitor closely and follow q6h cbc, transfuse as necessary. -please call GI in AM ( Pt has an appointment with GI, Dr. Dulce Sellar on 03/31/16).  Syncope: She had one episode of syncope, which is most likely due to orthostatic status secondary to blood loss. Patient denies head injury, refused CT scan of the head -Blood transfusion as above -pt/ot  Hypothyroidism: Last TSH was not on record -Continue home Synthroid -Check TSH  Hypokalemia: K= 3.4 on admission. - Repleted  DVT ppx: SCD Code Status: Full code Family Communication:  Yes, patient's wife at bed side Disposition Plan:  Anticipate discharge back to previous home environment Consults called:  none Admission status: Obs / tele   Date of Service 03/29/2016    Lorretta Harp [facility] Pager [phone]  If 7PM-7AM, please contact night-coverage [email or web address] Password [facility] 03/29/2016, 5:43 AM

## 2016-03-29 NOTE — Consult Note (Signed)
[facility] CONSULT Reason for consult: G.I. bleeding Referring Physician: [facility]. PCP: Dr. Phillips Odor. Primary G.I.: Dr. Karie Schwalbe Kelsey King is an 55 y.o. female.  HPI: she was hospitalized and followed by Dr. Dulce Sellar 1 month ago with a diverticular abscess requiring percutaneous drainage. A recent CT scan showed the abscess to be completely gone and the percutaneous strain was removed. She is scheduled to see Dr. Dulce Sellar in the office in the next several days. The patient notes that when she went home from the hospital 1 month ago she was given iron because of anemia and has continued to have loose dark stools. Most recently she felt that her stool smell worse and felt more fatigued. She was lightheaded and short of breath on exertion. She had a near syncopal episode. She saw her PCP in her hemoglobin to drop from 12.0 to 7.4. Her INR was fine and her stool was positive for FOB. She denies the use of any NSAIDs or any upper abdominal pain. She takes daily Tums for calcium. She's never had ulcers or any significant Gerd symptoms. Hemoglobin up to about 9.2 after transfusion.  Past Medical History:  Diagnosis Date  . Arthritis   . Hypothyroidism     Past Surgical History:  Procedure Laterality Date  . ECTOPIC PREGNANCY SURGERY      Family History  Problem Relation Age of Onset  . Breast cancer Maternal Grandmother   . Cancer Father     jaw cancer  . Diverticulosis Mother   . Diabetes Sister     Social History:  reports that she has never smoked. She has never used smokeless tobacco. She reports that she does not drink alcohol or use drugs.  Allergies: No Known Allergies  Medications; Prior to Admission medications   Medication Sig Start Date End Date Taking? Authorizing Provider  aspirin EC 81 MG tablet Take 81 mg by mouth daily.   Yes Historical Provider, MD  calcium carbonate (TUMS - DOSED IN MG ELEMENTAL CALCIUM) 500 MG chewable tablet Chew 2 tablets by  mouth daily.   Yes Historical Provider, MD  ferrous sulfate (FERROUSUL) 325 (65 FE) MG tablet Take 1 tablet (325 mg total) by mouth 3 (three) times daily with meals. Patient taking differently: Take 325 mg by mouth 2 (two) times daily.  02/23/16  Yes Rodolph Bong, MD  levothyroxine (SYNTHROID, LEVOTHROID) 25 MCG tablet Take 37.5 mcg by mouth daily before breakfast.    Yes Historical Provider, MD   . calcium carbonate  2 tablet Oral QAC supper  . ferrous sulfate  325 mg Oral BID WC  . levothyroxine  37.5 mcg Oral QAC breakfast  . pantoprazole  40 mg Intravenous Q12H  . sodium chloride flush  3 mL Intravenous Q12H   PRN Meds acetaminophen **OR** acetaminophen, ondansetron Results for orders placed or performed during the hospital encounter of 03/28/16 (from the past 48 hour(s))  Comprehensive metabolic panel     Status: Abnormal   Collection Time: 03/28/16  7:31 PM  Result Value Ref Range   Sodium 138 135 - 145 mmol/L   Potassium 3.4 (L) 3.5 - 5.1 mmol/L   Chloride 109 101 - 111 mmol/L   CO2 23 22 - 32 mmol/L   Glucose, Bld 105 (H) 65 - 99 mg/dL   BUN 15 6 - 20 mg/dL   Creatinine, Ser 0.42 0.44 - 1.00 mg/dL   Calcium 8.7 (L) 8.9 - 10.3 mg/dL   Total Protein 6.4 (L) 6.5 - 8.1  g/dL   Albumin 3.8 3.5 - 5.0 g/dL   AST 18 15 - 41 U/L   ALT 20 14 - 54 U/L   Alkaline Phosphatase 69 38 - 126 U/L   Total Bilirubin 0.6 0.3 - 1.2 mg/dL   GFR calc non Af Amer >60 >60 mL/min   GFR calc Af Amer >60 >60 mL/min    Comment: (NOTE) The eGFR has been calculated using the CKD EPI equation. This calculation has not been validated in all clinical situations. eGFR's persistently <60 mL/min signify possible Chronic Kidney Disease.    Anion gap 6 5 - 15  CBC     Status: Abnormal   Collection Time: 03/28/16  7:31 PM  Result Value Ref Range   WBC 12.7 (H) 4.0 - 10.5 K/uL   RBC 2.61 (L) 3.87 - 5.11 MIL/uL   Hemoglobin 7.4 (L) 12.0 - 15.0 g/dL   HCT 24.1 (L) 36.0 - 46.0 %   MCV 92.3 78.0 - 100.0  fL   MCH 28.4 26.0 - 34.0 pg   MCHC 30.7 30.0 - 36.0 g/dL   RDW 19.1 (H) 11.5 - 15.5 %   Platelets 334 150 - 400 K/uL  Type and screen [place]     Status: None (Preliminary result)   Collection Time: 03/28/16  7:31 PM  Result Value Ref Range   ABO/RH(D) A POS    Antibody Screen NEG    Sample Expiration 03/31/2016    Unit Number [id number]    Blood Component Type RED CELLS,LR    Unit division 00    Status of Unit ISSUED    Transfusion Status OK TO TRANSFUSE    Crossmatch Result Compatible    Unit Number [id number]    Blood Component Type RED CELLS,LR    Unit division 00    Status of Unit ISSUED    Transfusion Status OK TO TRANSFUSE    Crossmatch Result Compatible   ABO/Rh     Status: None   Collection Time: 03/28/16  7:31 PM  Result Value Ref Range   ABO/RH(D) A POS   Prepare RBC     Status: None   Collection Time: 03/28/16 11:53 PM  Result Value Ref Range   Order Confirmation ORDER PROCESSED BY BLOOD BANK   POC occult blood, ED     Status: Abnormal   Collection Time: 03/29/16 12:01 AM  Result Value Ref Range   Fecal Occult Bld POSITIVE (A) NEGATIVE  APTT     Status: None   Collection Time: 03/29/16 12:35 AM  Result Value Ref Range   aPTT 27 24 - 36 seconds  Protime-INR     Status: None   Collection Time: 03/29/16 12:35 AM  Result Value Ref Range   Prothrombin Time 14.3 11.4 - 15.2 seconds   INR 1.10   I-stat troponin, ED     Status: None   Collection Time: 03/29/16  1:10 AM  Result Value Ref Range   Troponin i, poc 0.00 0.00 - 0.08 ng/mL   Comment 3            Comment: Due to the release kinetics of cTnI, a negative result within the first hours of the onset of symptoms does not rule out myocardial infarction with certainty. If myocardial infarction is still suspected, repeat the test at appropriate intervals.   CBC     Status: Abnormal   Collection Time: 03/29/16  5:08 AM  Result Value Ref Range   WBC 8.6 4.0 -  10.5 K/uL   RBC  3.32 (L) 3.87 - 5.11 MIL/uL   Hemoglobin 9.2 (L) 12.0 - 15.0 g/dL   HCT 03.5 (L) 57.3 - 37.8 %   MCV 89.5 78.0 - 100.0 fL   MCH 27.7 26.0 - 34.0 pg   MCHC 31.0 30.0 - 36.0 g/dL   RDW 01.0 (H) 81.0 - 65.3 %   Platelets 277 150 - 400 K/uL  TSH     Status: None   Collection Time: 03/29/16  5:08 AM  Result Value Ref Range   TSH 2.827 0.350 - 4.500 uIU/mL  CBC     Status: Abnormal   Collection Time: 03/29/16  8:01 AM  Result Value Ref Range   WBC 7.8 4.0 - 10.5 K/uL   RBC 3.15 (L) 3.87 - 5.11 MIL/uL   Hemoglobin 8.7 (L) 12.0 - 15.0 g/dL   HCT 99.0 (L) 85.2 - 05.0 %   MCV 89.2 78.0 - 100.0 fL   MCH 27.6 26.0 - 34.0 pg   MCHC 31.0 30.0 - 36.0 g/dL   RDW 50.9 (H) 18.5 - 99.5 %   Platelets 266 150 - 400 K/uL  Basic metabolic panel     Status: Abnormal   Collection Time: 03/29/16  8:01 AM  Result Value Ref Range   Sodium 144 135 - 145 mmol/L   Potassium 3.7 3.5 - 5.1 mmol/L   Chloride 114 (H) 101 - 111 mmol/L   CO2 24 22 - 32 mmol/L   Glucose, Bld 95 65 - 99 mg/dL   BUN 8 6 - 20 mg/dL   Creatinine, Ser 6.67 0.44 - 1.00 mg/dL   Calcium 8.4 (L) 8.9 - 10.3 mg/dL   GFR calc non Af Amer >60 >60 mL/min   GFR calc Af Amer >60 >60 mL/min    Comment: (NOTE) The eGFR has been calculated using the CKD EPI equation. This calculation has not been validated in all clinical situations. eGFR's persistently <60 mL/min signify possible Chronic Kidney Disease.    Anion gap 6 5 - 15    Dg Chest 2 View  Result Date: 03/29/2016 CLINICAL DATA:  55 y/o  F; shortness of breath and syncope tonight. EXAM: CHEST  2 VIEW COMPARISON:  None. FINDINGS: The heart size and mediastinal contours are within normal limits. Both lungs are clear. Mild degenerative changes of the thoracic spine. IMPRESSION: No active cardiopulmonary disease. Electronically Signed   By: Mitzi Hansen M.D.   On: 03/29/2016 00:29   ROS: Constitutional:Marked fatigue and shortness of breath as noted above. No fever or  chills HEENT: no visual changes or upper respiratory symptoms.  Cardiovascular: no chest pain but has had some palpitations Respiratory: clear GI: as above GU: no dysuria or visible blood Musculoskeletal: no chronic musculoskeletal pain or use of any NSAIDs Neuro/Psychiatric: negative Endocrine/Heme: negative            Blood pressure (!) 150/82, pulse 83, temperature 98.3 F (36.8 C), temperature source Oral, resp. rate 16, height 5\' 7"  (1.702 m), weight 78.2 kg (172 lb 8 oz), SpO2 100 %.  Physical exam:   General-- pleasant white female no acute distress ENT-- nonicteric Neck-- no lymphadenopathy Heart-- regular rate and rhythm without murmurs are gallops Lungs-- clear Abdomen-- soft and completely nontender Psych-- alert and oriented   Assessment: 1. G.I. bleed. Patient stools are positive. She clearly could be bleeding from her: but this is dark and I think an upper G.I. bleed needs to be ruled out. Somewhat hesitant about doing  a colonoscopy so early after diverticulitis and percutaneous strain. 2. Diverticulitis with. Diverticular abscess. Percutaneous drain recently removed  Plan: 1. We will keep her NPO for now and proceed with EGD later today to evaluate for ulcers etc. Make further recommendations about colonoscopy depending on those results.   Dossie Ocanas JR,Areliz Rothman L 03/29/2016, 10:13 AM   This note was created using voice recognition software and minor errors may Have occurred unintentionally. Pager: [phone] If no answer or after hours call [phone]

## 2016-03-29 NOTE — Anesthesia Postprocedure Evaluation (Signed)
Anesthesia Post Note  Patient: Kelsey King  Procedure(s) Performed: Procedure(s) (LRB): ESOPHAGOGASTRODUODENOSCOPY (EGD) (N/A)  Patient location during evaluation: Endoscopy Anesthesia Type: MAC Level of consciousness: awake and alert Pain management: pain level controlled Vital Signs Assessment: post-procedure vital signs reviewed and stable Respiratory status: spontaneous breathing, nonlabored ventilation, respiratory function stable and patient connected to nasal cannula oxygen Cardiovascular status: stable and blood pressure returned to baseline Anesthetic complications: no    Last Vitals:  Vitals:   03/29/16 1450 03/29/16 1500  BP: 130/66 135/66  Pulse: 84 79  Resp: 18 18  Temp:      Last Pain:  Vitals:   03/29/16 1437  TempSrc: Oral  PainSc:                  Cecile HearingStephen Edward Turk

## 2016-03-29 NOTE — Transfer of Care (Signed)
Immediate Anesthesia Transfer of Care Note  Patient: Jerene Pitchnne W Dinger  Procedure(s) Performed: Procedure(s): ESOPHAGOGASTRODUODENOSCOPY (EGD) (N/A)  Patient Location: Endoscopy Unit  Anesthesia Type:MAC  Level of Consciousness: awake and alert   Airway & Oxygen Therapy: Patient Spontanous Breathing and Patient connected to nasal cannula oxygen  Post-op Assessment: Report given to RN and Post -op Vital signs reviewed and stable  Post vital signs: Reviewed and stable  Last Vitals:  Vitals:   03/29/16 1038 03/29/16 1355  BP: (!) 142/87 (!) 156/93  Pulse: 87 79  Resp: 16 18  Temp: 36.7 C 37.1 C    Last Pain:  Vitals:   03/29/16 1355  TempSrc: Oral  PainSc:          Complications: No apparent anesthesia complications

## 2016-03-30 DIAGNOSIS — E039 Hypothyroidism, unspecified: Secondary | ICD-10-CM

## 2016-03-30 DIAGNOSIS — D62 Acute posthemorrhagic anemia: Secondary | ICD-10-CM | POA: Diagnosis not present

## 2016-03-30 DIAGNOSIS — K922 Gastrointestinal hemorrhage, unspecified: Secondary | ICD-10-CM | POA: Diagnosis not present

## 2016-03-30 DIAGNOSIS — E876 Hypokalemia: Secondary | ICD-10-CM | POA: Diagnosis not present

## 2016-03-30 LAB — TYPE AND SCREEN
ABO/RH(D): A POS
ANTIBODY SCREEN: NEGATIVE
UNIT DIVISION: 0
Unit division: 0

## 2016-03-30 LAB — CBC
HCT: 33.1 % — ABNORMAL LOW (ref 36.0–46.0)
Hemoglobin: 10.3 g/dL — ABNORMAL LOW (ref 12.0–15.0)
MCH: 27.8 pg (ref 26.0–34.0)
MCHC: 31.1 g/dL (ref 30.0–36.0)
MCV: 89.2 fL (ref 78.0–100.0)
PLATELETS: 349 10*3/uL (ref 150–400)
RBC: 3.71 MIL/uL — ABNORMAL LOW (ref 3.87–5.11)
RDW: 19.4 % — ABNORMAL HIGH (ref 11.5–15.5)
WBC: 8.9 10*3/uL (ref 4.0–10.5)

## 2016-03-30 LAB — GLUCOSE, CAPILLARY: GLUCOSE-CAPILLARY: 96 mg/dL (ref 65–99)

## 2016-03-30 MED ORDER — PANTOPRAZOLE SODIUM 40 MG PO TBEC: 40.0000 mg | DELAYED_RELEASE_TABLET | Freq: Every day | ORAL | 0 refills | Status: AC

## 2016-03-30 NOTE — Progress Notes (Signed)
Pt has orders to be discharged. Discharge instructions given and pt has no additional questions at this time. Medication regimen reviewed and pt educated. Pt verbalized understanding and has no additional questions. Telemetry box removed. IV removed and site in good condition. Pt stable and waiting for transportation. 

## 2016-03-30 NOTE — Discharge Summary (Signed)
PATIENT DETAILS Name: Kelsey King Age: 55 y.o. Sex: female Date of Birth: 10/31/1960 MRN: [id number]. Admitting Physician: Lorretta Harp, MD FAO:[id number], Chancy Hurter, MD  Admit Date: 03/28/2016 Discharge date: 03/30/2016  Recommendations for Outpatient Follow-up:  1. Follow up with PCP in 1 weeks 2. Ensure follow-up with GI to determine timing of colonoscopy 3. Please obtain BMP/CBC in one week  Admitted From:  Home  Disposition: Home    Home Health: No  Equipment/Devices: None  Discharge Condition: Stable  CODE STATUS: FULL CODE  Diet recommendation:  Regular   Brief Summary: See H&P, Labs, Consult and Test reports for all details in brief, patient 55 year old female with a recent history of diverticulitis and abscess requiring CT-guided drainage, presented with melena and acute blood loss anemia. She was found to have a hemoglobin of 7.4, she required PRBC transfusion. She was evaluated by gastroenterology, underwent endoscopy on 8/22 which did not show any major foci of bleeding. Hemoglobin is stable since transfusion, and patient is deemed stable to be discharged today with close outpatient follow-up with gastroenterology.   Brief Hospital Course:  Presumed upper GIB (gastrointestinal bleeding): Likely upper, but no foci of bleeding evident on endoscopy. Endoscopy showed significant esophagitis and stricture. Recommendations from gastroenterology are to discharge on PPI. GI is planning on a colonoscopy sometime in mid September. No further melena while in the hospital. Resume aspirin in the next few weeks once felt okay by PCP.  Acute blood loss anemia: Transfused PRBC this admission. Hemoglobin stable since then. Please repeat CBC in 1 week.  Syncope: Occurred a few days prior to this hospitalization, likely secondary to GI bleeding/anemia and resultant orthostatics hypertension. Telemetry was negative.  Hypokalemia: Repleted  Hypothyroidism: Continue  Synthroid  Recent diverticulitis with abscess: Had a CT-guided drain placed that was removed a few weeks prior to this admission. GI not recommending a colonoscopy until she is at least 6-8 weeks out from her CT-guided drainage.  Procedures/Studies: EGD 8/22  Discharge Diagnoses:  Principal Problem:  Upper GIB (gastrointestinal bleeding) Active Problems:   Symptomatic anemia   Hypothyroidism   Hypokalemia  Discharge Instructions:  Activity:  As tolerated   Discharge Instructions    Call MD for:    Complete by:  As directed   Frequent black stools, lightheadedness, blood in the stool, if you  pass out, if you Vomit blood   Diet general    Complete by:  As directed   Discharge instructions    Complete by:  As directed   You had Gastrointestinal Bleeding: Please ask your Primary MD to check a complete blood count within one week of discharge or at your next visit.  Do not take Aspirin until you ask your primary MD.   Follow with Primary MD  Colette Ribas, MD  and other consultant's as instructed your Hospitalist MD  Please get a complete blood count and chemistry panel checked by your Primary MD at your next visit, and again as instructed by your Primary MD.  Get Medicines reviewed and adjusted: Please take all your medications with you for your next visit with your Primary MD  Laboratory/radiological data: Please request your Primary MD to go over all hospital tests and procedure/radiological results at the follow up, please ask your Primary MD to get all Hospital records sent to his/her office.  In some cases, they will be blood work, cultures and biopsy results pending at the time of your discharge. Please request that your primary care M.D. follows  up on these results.  Also Note the following: If you experience worsening of your admission symptoms, develop shortness of breath, life threatening emergency, suicidal or homicidal thoughts you must seek medical attention  immediately by calling 911 or calling your MD immediately  if symptoms less severe.  You must read complete instructions/literature along with all the possible adverse reactions/side effects for all the Medicines you take and that have been prescribed to you. Take any new Medicines after you have completely understood and accpet all the possible adverse reactions/side effects.   Do not drive when taking Pain medications or sleeping medications (Benzodaizepines)  Do not take more than prescribed Pain, Sleep and Anxiety Medications. It is not advisable to combine anxiety,sleep and pain medications without talking with your primary care practitioner  Special Instructions: If you have smoked or chewed Tobacco  in the last 2 yrs please stop smoking, stop any regular Alcohol  and or any Recreational drug use.  Wear Seat belts while driving.  Please note: You were cared for by a hospitalist during your hospital stay. Once you are discharged, your primary care physician will handle any further medical issues. Please note that NO REFILLS for any discharge medications will be authorized once you are discharged, as it is imperative that you return to your primary care physician (or establish a relationship with a primary care physician if you do not have one) for your post hospital discharge needs so that they can reassess your need for medications and monitor your lab values.   Increase activity slowly    Complete by:  As directed       Medication List    STOP taking these medications   aspirin EC 81 MG tablet     TAKE these medications   calcium carbonate 500 MG chewable tablet Commonly known as:  TUMS - dosed in mg elemental calcium Chew 2 tablets by mouth daily.   ferrous sulfate 325 (65 FE) MG tablet Commonly known as:  FERROUSUL Take 1 tablet (325 mg total) by mouth 3 (three) times daily with meals. What changed:  when to take this   levothyroxine 25 MCG tablet Commonly known as:   SYNTHROID, LEVOTHROID Take 37.5 mcg by mouth daily before breakfast.   pantoprazole 40 MG tablet Commonly known as:  PROTONIX Take 1 tablet (40 mg total) by mouth daily.      Follow-up Information    Colette RibasGOLDING, JOHN CABOT, MD. Schedule an appointment as soon as possible for a visit in 1 week(s).   Specialty:  Family Medicine Contact information: [address] [place] [address] [phone]        Freddy JakschUTLAW,WILLIAM M, MD Follow up on 03/31/2016.   Specialty:  Gastroenterology Why:  Keep your appointment tomorrow Contact information: [address] [place] [address] [phone]          No Known Allergies    Consultations:   GI  Other Procedures/Studies: Dg Chest 2 View  Result Date: 03/29/2016 CLINICAL DATA:  55 y/o  F; shortness of breath and syncope tonight. EXAM: CHEST  2 VIEW COMPARISON:  None. FINDINGS: The heart size and mediastinal contours are within normal limits. Both lungs are clear. Mild degenerative changes of the thoracic spine. IMPRESSION: No active cardiopulmonary disease. Electronically Signed   By: Mitzi HansenLance  Furusawa-Stratton M.D.   On: 03/29/2016 00:29   Ct Abdomen Pelvis W Contrast  Result Date: 03/02/2016 CLINICAL DATA:  Left lower quadrant diverticular abscess, status post percutaneous drain 02/20/2016 EXAM:  CT ABDOMEN AND PELVIS WITH CONTRAST TECHNIQUE: Multidetector CT imaging of the abdomen and pelvis was performed using the standard protocol following bolus administration of intravenous contrast. CONTRAST:  ISOVUE-300 IOPAMIDOL (ISOVUE-300) INJECTION 61% COMPARISON:  02/18/2016, 02/20/2016 FINDINGS: Lower chest:  No acute findings. Hepatobiliary: No focal hepatic abnormality or biliary dilatation. Patent portal vein. Incidental cholelithiasis noted without gallbladder distention or biliary dilatation. Pancreas: Slight degree of fatty replacement without a focal abnormality or ductal dilatation Spleen: Within normal  limits in size.  No focal abnormality. Adrenals/Urinary Tract: No masses identified. No evidence of hydronephrosis. Stomach/Bowel: Moderate hiatal hernia. Negative for bowel obstruction, significant dilatation, ileus, or free air. Scattered colonic diverticulosis. Appendix demonstrates a calcified appendicolith without acute inflammation. Left lower quadrant diverticular abscess drain stable in position. The abscess appears resolved. Sigmoid diverticulitis has also resolved. No current acute inflammatory process. No new fluid collection. Vascular/Lymphatic: Minor atherosclerosis of the aorta. Negative for aneurysm or occlusive process. Retro aortic left renal vein noted, a normal variant. No adenopathy. Reproductive: Uterus normal in size and midline. Right adnexal ovarian cyst measures 3.8 x 3.1 cm, image 73. Other: No inguinal abnormality or hernia.  Intact abdominal wall. Musculoskeletal:  No acute osseous finding. IMPRESSION: Resolved left lower quadrant diverticular abscess following percutaneous drain. Resolved adjacent sigmoid diverticulitis. No new fluid collection, abscess, obstruction or free air. Cholelithiasis Moderate hiatal hernia Aortic atherosclerosis without aneurysm Incidental appendicolith noted. 3.8 x 3.2 cm right ovarian cyst Electronically Signed   By: Judie Petit.  Shick M.D.   On: 03/02/2016 09:01  Dg Sinus/fist Tube Chk-non Gi  Result Date: 03/10/2016 CLINICAL DATA:  55 year old female with a history of diverticular abscess. She has undergone prior drain placement. EXAM: ABSCESS INJECTION COMPARISON:  03/02/2016, prior injection 03/02/2016 FINDINGS: Drain injection of left lower quadrant demonstrates no significant residual abscess and no evidence of fistula. Drain was removed. IMPRESSION: Drain injection demonstrates no residual abscess cavity and no evidence of fistula. Drain was removed at the bedside. Signed, Yvone Neu. Loreta Ave, DO [facility]  Radiology Electronically Signed   By: Gilmer Mor D.O.   On: 03/10/2016 10:11   Dg Sinus/fist Tube Chk-non Gi  Result Date: 03/02/2016 CLINICAL DATA:  Left lower quadrant diverticular abscess, status post percutaneous drain. EXAM: ABSCESS INJECTION CONTRAST:  10 cc Omnipaque 300 FLUOROSCOPY TIME:  Radiation Exposure Index (as provided by the fluoroscopic device): 84 dGycm2 If the device does not provide the exposure index: Fluoroscopy Time (in minutes and seconds):  1 minutes 12 seconds Number of Acquired Images:  5 COMPARISON:  03/02/2016 CT FINDINGS: Injection of the existing left lower quadrant abscess drain performed under fluoroscopy. Small irregular collapsed abscess cavity noted at the drain catheter site. No definite fistula communication to the adjacent sigmoid. Leakage at the skin site noted. IMPRESSION: Negative for fistula to the colon. Small residual irregular but collapsed abscess cavity at the drain site. Electronically Signed   By: Judie Petit.  Shick M.D.   On: 03/02/2016 09:48    TODAY-DAY OF DISCHARGE:  Subjective:   Kelsey King today has no headache,no chest abdominal pain,no new weakness tingling or numbness, feels much better wants to go home today.   Objective:   Blood pressure (!) 144/76, pulse 72, temperature 98.3 F (36.8 C), temperature source Oral, resp. rate 16, height 5\' 7"  (1.702 m), weight 76.4 kg (168 lb 6.4 oz), SpO2 98 %.  Intake/Output Summary (Last 24 hours) at 03/30/16 1006 Last data filed at 03/30/16 0700  Gross per 24 hour  Intake           1237.5 ml  Output             5550 ml  Net          -4312.5 ml   Filed Weights   03/29/16 0431 03/29/16 1355 03/30/16 0508  Weight: 78.2 kg (172 lb 8 oz) 78 kg (172 lb) 76.4 kg (168 lb 6.4 oz)    Exam: Awake Alert, Oriented *3, No new F.N deficits, Normal affect George.AT,PERRAL Supple Neck,No JVD, No cervical lymphadenopathy appriciated.  Symmetrical Chest wall movement, Good air movement bilaterally, CTAB RRR,No  Gallops,Rubs or new Murmurs, No Parasternal Heave +ve B.Sounds, Abd Soft, Non tender, No organomegaly appriciated, No rebound -guarding or rigidity. No Cyanosis, Clubbing or edema, No new Rash or bruise   PERTINENT RADIOLOGIC STUDIES: Dg Chest 2 View  Result Date: 03/29/2016 CLINICAL DATA:  55 y/o  F; shortness of breath and syncope tonight. EXAM: CHEST  2 VIEW COMPARISON:  None. FINDINGS: The heart size and mediastinal contours are within normal limits. Both lungs are clear. Mild degenerative changes of the thoracic spine. IMPRESSION: No active cardiopulmonary disease. Electronically Signed   By: Mitzi Hansen M.D.   On: 03/29/2016 00:29   Ct Abdomen Pelvis W Contrast  Result Date: 03/02/2016 CLINICAL DATA:  Left lower quadrant diverticular abscess, status post percutaneous drain 02/20/2016 EXAM: CT ABDOMEN AND PELVIS WITH CONTRAST TECHNIQUE: Multidetector CT imaging of the abdomen and pelvis was performed using the standard protocol following bolus administration of intravenous contrast. CONTRAST:  ISOVUE-300 IOPAMIDOL (ISOVUE-300) INJECTION 61% COMPARISON:  02/18/2016, 02/20/2016 FINDINGS: Lower chest:  No acute findings. Hepatobiliary: No focal hepatic abnormality or biliary dilatation. Patent portal vein. Incidental cholelithiasis noted without gallbladder distention or biliary dilatation. Pancreas: Slight degree of fatty replacement without a focal abnormality or ductal dilatation Spleen: Within normal limits in size.  No focal abnormality. Adrenals/Urinary Tract: No masses identified. No evidence of hydronephrosis. Stomach/Bowel: Moderate hiatal hernia. Negative for bowel obstruction, significant dilatation, ileus, or free air. Scattered colonic diverticulosis. Appendix demonstrates a calcified appendicolith without acute inflammation. Left lower quadrant diverticular abscess drain stable in position. The abscess appears resolved. Sigmoid diverticulitis has also resolved. No  current acute inflammatory process. No new fluid collection. Vascular/Lymphatic: Minor atherosclerosis of the aorta. Negative for aneurysm or occlusive process. Retro aortic left renal vein noted, a normal variant. No adenopathy. Reproductive: Uterus normal in size and midline. Right adnexal ovarian cyst measures 3.8 x 3.1 cm, image 73. Other: No inguinal abnormality or hernia.  Intact abdominal wall. Musculoskeletal:  No acute osseous finding. IMPRESSION: Resolved left lower quadrant diverticular abscess following percutaneous drain. Resolved adjacent sigmoid diverticulitis. No new fluid collection, abscess, obstruction or free air. Cholelithiasis Moderate hiatal hernia Aortic atherosclerosis without aneurysm Incidental appendicolith noted. 3.8 x 3.2 cm right ovarian cyst Electronically Signed   By: Judie Petit.  Shick M.D.   On: 03/02/2016 09:01  Dg Sinus/fist Tube Chk-non Gi  Result Date: 03/10/2016 CLINICAL DATA:  55 year old female with a history of diverticular abscess. She has undergone prior drain placement. EXAM: ABSCESS INJECTION COMPARISON:  03/02/2016, prior injection 03/02/2016 FINDINGS: Drain injection of left lower quadrant demonstrates no significant residual abscess and no evidence of fistula. Drain was removed. IMPRESSION: Drain injection demonstrates no residual abscess cavity and no evidence of fistula. Drain was removed at the bedside. Signed, Yvone Neu. Loreta Ave, DO [facility] Electronically Signed   By: Gilmer Mor D.O.   On: 03/10/2016  10:11   Dg Sinus/fist Tube Chk-non Gi  Result Date: 03/02/2016 CLINICAL DATA:  Left lower quadrant diverticular abscess, status post percutaneous drain. EXAM: ABSCESS INJECTION CONTRAST:  10 cc Omnipaque 300 FLUOROSCOPY TIME:  Radiation Exposure Index (as provided by the fluoroscopic device): 84 dGycm2 If the device does not provide the exposure index: Fluoroscopy Time (in minutes and seconds):  1 minutes  12 seconds Number of Acquired Images:  5 COMPARISON:  03/02/2016 CT FINDINGS: Injection of the existing left lower quadrant abscess drain performed under fluoroscopy. Small irregular collapsed abscess cavity noted at the drain catheter site. No definite fistula communication to the adjacent sigmoid. Leakage at the skin site noted. IMPRESSION: Negative for fistula to the colon. Small residual irregular but collapsed abscess cavity at the drain site. Electronically Signed   By: Judie PetitM.  Shick M.D.   On: 03/02/2016 09:48    PERTINENT LAB RESULTS: CBC:  Recent Labs  03/29/16 2023 03/30/16 0643  WBC 9.3 8.9  HGB 9.6* 10.3*  HCT 30.9* 33.1*  PLT 155 349   CMET CMP     Component Value Date/Time   NA 144 03/29/2016 0801   K 3.7 03/29/2016 0801   CL 114 (H) 03/29/2016 0801   CO2 24 03/29/2016 0801   GLUCOSE 95 03/29/2016 0801   BUN 8 03/29/2016 0801   CREATININE 0.77 03/29/2016 0801   CALCIUM 8.4 (L) 03/29/2016 0801   PROT 6.4 (L) 03/28/2016 1931   ALBUMIN 3.8 03/28/2016 1931   AST 18 03/28/2016 1931   ALT 20 03/28/2016 1931   ALKPHOS 69 03/28/2016 1931   BILITOT 0.6 03/28/2016 1931   GFRNONAA >60 03/29/2016 0801   GFRAA >60 03/29/2016 0801    GFR Estimated Creatinine Clearance: 83.7 mL/min (by C-G formula based on SCr of 0.8 mg/dL). No results for input(s): LIPASE, AMYLASE in the last 72 hours. No results for input(s): CKTOTAL, CKMB, CKMBINDEX, TROPONINI in the last 72 hours. Invalid input(s): POCBNP No results for input(s): DDIMER in the last 72 hours. No results for input(s): HGBA1C in the last 72 hours. No results for input(s): CHOL, HDL, LDLCALC, TRIG, CHOLHDL, LDLDIRECT in the last 72 hours.  Recent Labs  03/29/16 0508  TSH 2.827   No results for input(s): VITAMINB12, FOLATE, FERRITIN, TIBC, IRON, RETICCTPCT in the last 72 hours. Coags:  Recent Labs  03/29/16 0035  INR 1.10   Microbiology: No results found for this or any previous visit (from the past 240  hour(s)).  FURTHER DISCHARGE INSTRUCTIONS:  Get Medicines reviewed and adjusted: Please take all your medications with you for your next visit with your Primary MD  Laboratory/radiological data: Please request your Primary MD to go over all hospital tests and procedure/radiological results at the follow up, please ask your Primary MD to get all Hospital records sent to his/her office.  In some cases, they will be blood work, cultures and biopsy results pending at the time of your discharge. Please request that your primary care M.D. goes through all the records of your hospital data and follows up on these results.  Also Note the following: If you experience worsening of your admission symptoms, develop shortness of breath, life threatening emergency, suicidal or homicidal thoughts you must seek medical attention immediately by calling 911 or calling your MD immediately  if symptoms less severe.  You must read complete instructions/literature along with all the possible adverse reactions/side effects for all the Medicines you take and that have been prescribed to you. Take any new Medicines after  you have completely understood and accpet all the possible adverse reactions/side effects.   Do not drive when taking Pain medications or sleeping medications (Benzodaizepines)  Do not take more than prescribed Pain, Sleep and Anxiety Medications. It is not advisable to combine anxiety,sleep and pain medications without talking with your primary care practitioner  Special Instructions: If you have smoked or chewed Tobacco  in the last 2 yrs please stop smoking, stop any regular Alcohol  and or any Recreational drug use.  Wear Seat belts while driving.  Please note: You were cared for by a hospitalist during your hospital stay. Once you are discharged, your primary care physician will handle any further medical issues. Please note that NO REFILLS for any discharge medications will be authorized once  you are discharged, as it is imperative that you return to your primary care physician (or establish a relationship with a primary care physician if you do not have one) for your post hospital discharge needs so that they can reassess your need for medications and monitor your lab values.  Total Time spent coordinating discharge including counseling, education and face to face time equals 35 minutes.  SignedJeoffrey Massed 03/30/2016 10:06 AM

## 2016-03-30 NOTE — Progress Notes (Signed)
[facility] PROGRESS NOTE Subjective Pt w/o further bleeding. Has had occasional dysphagia Has appt with Dr Dulce Sellarutlaw tomorrow to f/u on abcess etc.  Objective: Vital signs in last 24 hours: Temp:  [98 F (36.7 C)-98.8 F (37.1 C)] 98.3 F (36.8 C) (08/23 0508) Pulse Rate:  [68-87] 72 (08/23 0508) Resp:  [16-26] 16 (08/23 0508) BP: (107-156)/(66-93) 144/76 (08/23 0508) SpO2:  [97 %-100 %] 98 % (08/23 0508) Weight:  [76.4 kg (168 lb 6.4 oz)-78 kg (172 lb)] 76.4 kg (168 lb 6.4 oz) (08/23 0508) Last BM Date: 03/29/16  Intake/Output from previous day: 08/22 0701 - 08/23 0700 In: 1492.5 [I.V.:1492.5] Out: 5550 [Urine:5550] Intake/Output this shift: No intake/output data recorded.    Lab Results:  Recent Labs  03/29/16 0508 03/29/16 0801 03/29/16 1545 03/29/16 2023 03/30/16 0643  WBC 8.6 7.8 9.3 9.3 8.9  HGB 9.2* 8.7* 9.3* 9.6* 10.3*  HCT 29.7* 28.1* 28.0* 30.9* 33.1*  PLT 277 266 292 155 349   BMET  Recent Labs  03/28/16 1931 03/29/16 0801  NA 138 144  K 3.4* 3.7  CL 109 114*  CO2 23 24  CREATININE 0.95 0.77   LFT  Recent Labs  03/28/16 1931  PROT 6.4*  AST 18  ALT 20  ALKPHOS 69  BILITOT 0.6   PT/INR  Recent Labs  03/29/16 0035  LABPROT 14.3  INR 1.10   PANCREAS No results for input(s): LIPASE in the last 72 hours.    Studies/Results: Dg Chest 2 View  Result Date: 03/29/2016 CLINICAL DATA:  55 y/o  F; shortness of breath and syncope tonight. EXAM: CHEST  2 VIEW COMPARISON:  None. FINDINGS: The heart size and mediastinal contours are within normal limits. Both lungs are clear. Mild degenerative changes of the thoracic spine. IMPRESSION: No active cardiopulmonary disease. Electronically Signed   By: Mitzi HansenLance  Furusawa-Stratton M.D.   On: 03/29/2016 00:29    Medications: I have reviewed the patient's current medications.  Assessment/Plan: 1. GI Bleed. EGD showed stricture with significant esophagitis. Would discharge on PPI with f/u  EGD ? Dilation in future. Will need colon 6-8 weeks after the percutaneous drain removed(about mid September) Keep appt with Dr Dulce Sellarutlaw. Home on PPI QD   Halen Mossbarger JR,Ayyub Krall L 03/30/2016, 9:17 AM  This note was created using voice recognition software. Minor errors may Have occurred unintentionally.  Pager: [phone] If no answer or after hours call [phone]

## 2016-05-06 ENCOUNTER — Encounter: Payer: Self-pay | Admitting: Interventional Radiology

## 2016-08-11 ENCOUNTER — Encounter: Payer: Self-pay | Admitting: Radiology

## 2016-09-27 DIAGNOSIS — D649 Anemia, unspecified: Secondary | ICD-10-CM | POA: Diagnosis not present

## 2016-10-06 DIAGNOSIS — K219 Gastro-esophageal reflux disease without esophagitis: Secondary | ICD-10-CM | POA: Diagnosis not present

## 2016-10-06 DIAGNOSIS — D649 Anemia, unspecified: Secondary | ICD-10-CM | POA: Diagnosis not present

## 2016-10-06 DIAGNOSIS — K5732 Diverticulitis of large intestine without perforation or abscess without bleeding: Secondary | ICD-10-CM | POA: Diagnosis not present

## 2016-10-27 DIAGNOSIS — I10 Essential (primary) hypertension: Secondary | ICD-10-CM | POA: Diagnosis not present

## 2016-11-09 DIAGNOSIS — D649 Anemia, unspecified: Secondary | ICD-10-CM | POA: Diagnosis not present

## 2016-11-14 DIAGNOSIS — Z01419 Encounter for gynecological examination (general) (routine) without abnormal findings: Secondary | ICD-10-CM | POA: Diagnosis not present

## 2016-11-14 DIAGNOSIS — E039 Hypothyroidism, unspecified: Secondary | ICD-10-CM | POA: Diagnosis not present

## 2017-01-09 DIAGNOSIS — K219 Gastro-esophageal reflux disease without esophagitis: Secondary | ICD-10-CM | POA: Diagnosis not present

## 2017-01-09 DIAGNOSIS — Z86010 Personal history of colonic polyps: Secondary | ICD-10-CM | POA: Diagnosis not present

## 2017-01-09 DIAGNOSIS — D649 Anemia, unspecified: Secondary | ICD-10-CM | POA: Diagnosis not present

## 2017-02-07 DIAGNOSIS — D485 Neoplasm of uncertain behavior of skin: Secondary | ICD-10-CM | POA: Diagnosis not present

## 2017-02-07 DIAGNOSIS — B070 Plantar wart: Secondary | ICD-10-CM | POA: Diagnosis not present

## 2017-02-07 DIAGNOSIS — D225 Melanocytic nevi of trunk: Secondary | ICD-10-CM | POA: Diagnosis not present

## 2017-02-07 DIAGNOSIS — Z1283 Encounter for screening for malignant neoplasm of skin: Secondary | ICD-10-CM | POA: Diagnosis not present

## 2017-03-13 IMAGING — RF DG SINUS / FISTULA TRACT / ABSCESSOGRAM
5 series · 5 of 5 positions shown · non-contrast
Comparison: 03/02/2016 CT

CLINICAL DATA: Left lower quadrant diverticular abscess, status
post percutaneous drain.

[Series 1: run · 1 of 1 slices shown (1 of 5)]
[im 1/1]
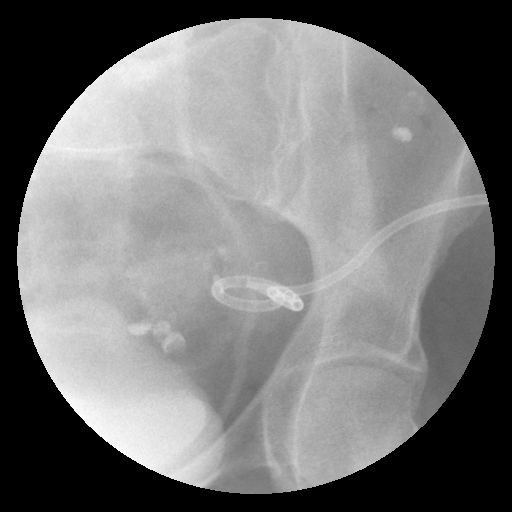

[Series 2: run · 1 of 1 slices shown (2 of 5)]
[im 1/1]
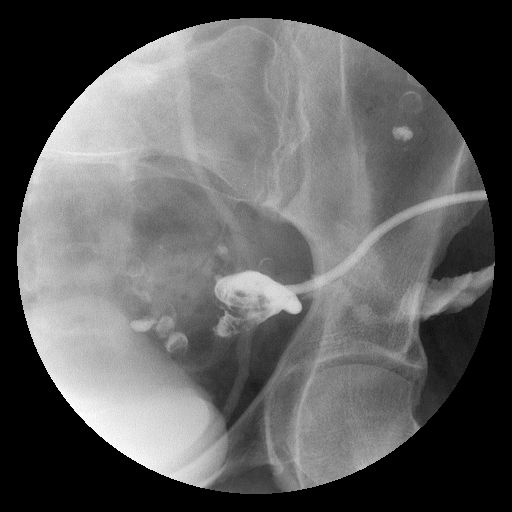

[Series 3: run · 1 of 1 slices shown (3 of 5)]
[im 1/1]
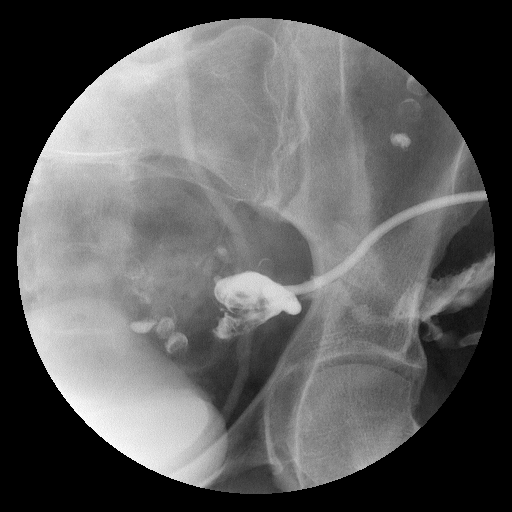

[Series 4: run · 1 of 1 slices shown (4 of 5)]
[im 1/1]
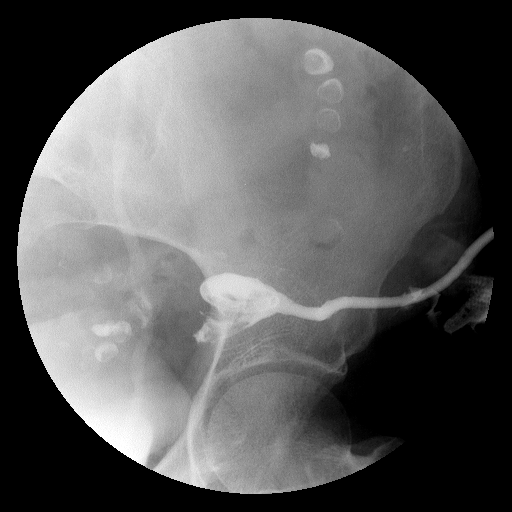

[Series 5: run · 1 of 1 slices shown (5 of 5)]
[im 1/1]
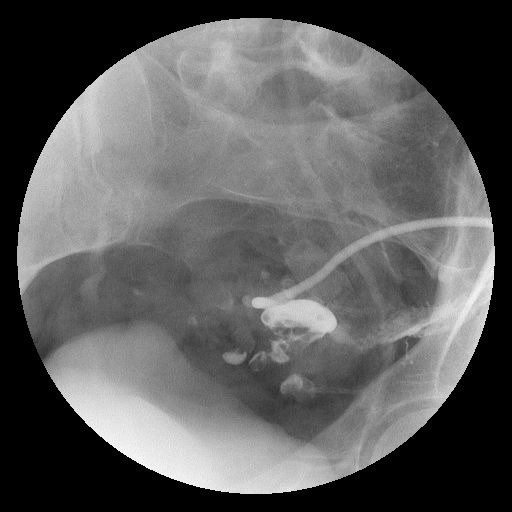

[5 of 5 positions shown; findings below may reference images not displayed]

EXAM:
ABSCESS INJECTION

CONTRAST:  10 cc Omnipaque 300

FLUOROSCOPY TIME:  Radiation Exposure Index (as provided by the
fluoroscopic device): 84 dHycm7

If the device does not provide the exposure index:

Fluoroscopy Time (in minutes and seconds):  1 minutes 12 seconds

Number of Acquired Images:  5
FINDINGS: Injection of the existing left lower quadrant abscess drain
performed under fluoroscopy. Small irregular collapsed abscess
cavity noted at the drain catheter site. No definite fistula
communication to the adjacent sigmoid. Leakage at the skin site
noted.
IMPRESSION: Negative for fistula to the colon.

Small residual irregular but collapsed abscess cavity at the drain
site.

## 2017-03-20 IMAGING — RF DG SINUS / FISTULA TRACT / ABSCESSOGRAM
3 series · 3 of 3 positions shown · non-contrast
Comparison: 03/02/2016, prior injection 03/02/2016

CLINICAL DATA: 55-year-old female with a history of diverticular
abscess. She has undergone prior drain placement.

EXAM:
ABSCESS INJECTION

[Series 1: run · 1 of 1 slices shown (1 of 3)]
[im 1/1]
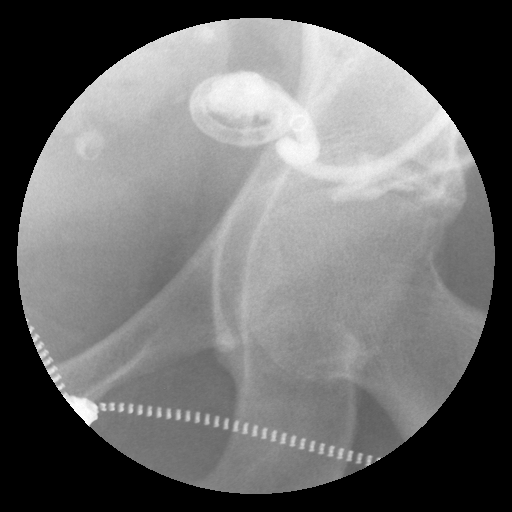

[Series 2: run · 1 of 1 slices shown (2 of 3)]
[im 1/1]
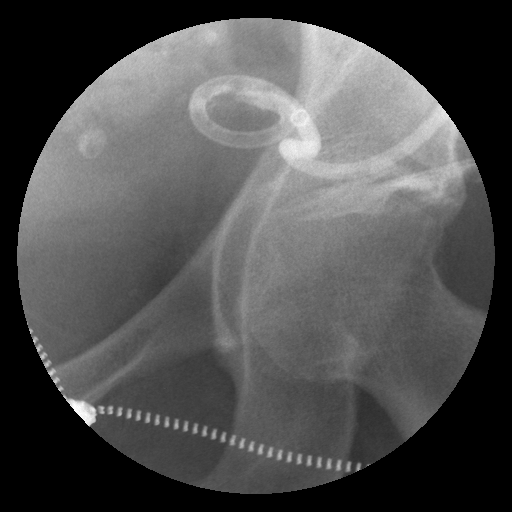

[Series 3: run · 1 of 1 slices shown (3 of 3)]
[im 1/1]
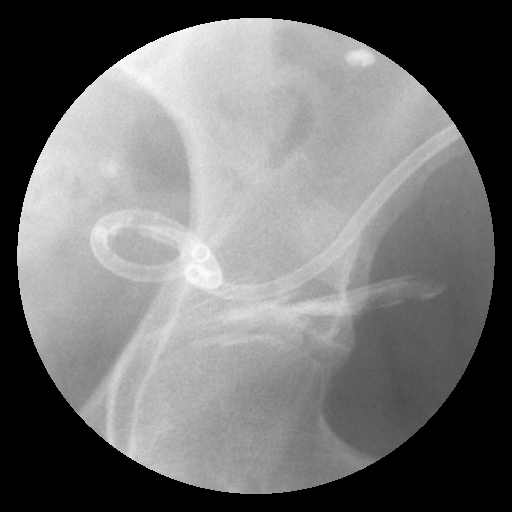

[3 of 3 positions shown; findings below may reference images not displayed]

FINDINGS: Drain injection of left lower quadrant demonstrates no significant
residual abscess and no evidence of fistula.

Drain was removed.
IMPRESSION: Drain injection demonstrates no residual abscess cavity and no
evidence of fistula.

Drain was removed at the bedside.

## 2017-06-02 DIAGNOSIS — Z23 Encounter for immunization: Secondary | ICD-10-CM | POA: Diagnosis not present

## 2017-06-08 DIAGNOSIS — D649 Anemia, unspecified: Secondary | ICD-10-CM | POA: Diagnosis not present

## 2017-06-20 DIAGNOSIS — D649 Anemia, unspecified: Secondary | ICD-10-CM | POA: Diagnosis not present

## 2017-06-20 DIAGNOSIS — Z86010 Personal history of colonic polyps: Secondary | ICD-10-CM | POA: Diagnosis not present

## 2017-07-24 DIAGNOSIS — E063 Autoimmune thyroiditis: Secondary | ICD-10-CM | POA: Diagnosis not present

## 2017-07-24 DIAGNOSIS — Z1389 Encounter for screening for other disorder: Secondary | ICD-10-CM | POA: Diagnosis not present

## 2017-07-24 DIAGNOSIS — K5720 Diverticulitis of large intestine with perforation and abscess without bleeding: Secondary | ICD-10-CM | POA: Diagnosis not present

## 2017-07-24 DIAGNOSIS — Z0001 Encounter for general adult medical examination with abnormal findings: Secondary | ICD-10-CM | POA: Diagnosis not present

## 2017-08-14 DIAGNOSIS — R319 Hematuria, unspecified: Secondary | ICD-10-CM | POA: Diagnosis not present

## 2017-08-14 DIAGNOSIS — N342 Other urethritis: Secondary | ICD-10-CM | POA: Diagnosis not present

## 2017-08-22 DIAGNOSIS — N390 Urinary tract infection, site not specified: Secondary | ICD-10-CM | POA: Diagnosis not present

## 2017-11-16 DIAGNOSIS — Z1231 Encounter for screening mammogram for malignant neoplasm of breast: Secondary | ICD-10-CM | POA: Diagnosis not present

## 2017-11-16 DIAGNOSIS — Z01419 Encounter for gynecological examination (general) (routine) without abnormal findings: Secondary | ICD-10-CM | POA: Diagnosis not present

## 2018-02-23 DIAGNOSIS — D649 Anemia, unspecified: Secondary | ICD-10-CM | POA: Diagnosis not present

## 2018-05-01 DIAGNOSIS — Z23 Encounter for immunization: Secondary | ICD-10-CM | POA: Diagnosis not present

## 2018-07-25 DIAGNOSIS — Z1389 Encounter for screening for other disorder: Secondary | ICD-10-CM | POA: Diagnosis not present

## 2018-07-25 DIAGNOSIS — E6609 Other obesity due to excess calories: Secondary | ICD-10-CM | POA: Diagnosis not present

## 2018-07-25 DIAGNOSIS — M2040 Other hammer toe(s) (acquired), unspecified foot: Secondary | ICD-10-CM | POA: Diagnosis not present

## 2018-07-25 DIAGNOSIS — E063 Autoimmune thyroiditis: Secondary | ICD-10-CM | POA: Diagnosis not present

## 2018-07-25 DIAGNOSIS — Z0001 Encounter for general adult medical examination with abnormal findings: Secondary | ICD-10-CM | POA: Diagnosis not present

## 2018-08-23 DIAGNOSIS — D649 Anemia, unspecified: Secondary | ICD-10-CM | POA: Diagnosis not present

## 2022-02-10 ENCOUNTER — Other Ambulatory Visit: Payer: Self-pay | Admitting: Obstetrics and Gynecology

## 2022-02-10 DIAGNOSIS — E2839 Other primary ovarian failure: Secondary | ICD-10-CM

## 2022-08-10 ENCOUNTER — Ambulatory Visit
Admission: RE | Admit: 2022-08-10 | Discharge: 2022-08-10 | Disposition: A | Discharge status: Home / Self Care | Payer: 59 | Source: Ambulatory Visit | Attending: Obstetrics and Gynecology | Admitting: Obstetrics and Gynecology

## 2022-08-10 DIAGNOSIS — E2839 Other primary ovarian failure: Secondary | ICD-10-CM

## 2023-06-01 ENCOUNTER — Encounter: Payer: Self-pay | Admitting: Radiology

## 2023-06-22 ENCOUNTER — Other Ambulatory Visit: Payer: Self-pay | Admitting: Internal Medicine

## 2023-06-22 ENCOUNTER — Ambulatory Visit
Admission: RE | Admit: 2023-06-22 | Discharge: 2023-06-22 | Disposition: A | Discharge status: Home / Self Care | Payer: 59 | Source: Ambulatory Visit | Attending: Internal Medicine | Admitting: Internal Medicine

## 2023-06-22 DIAGNOSIS — M25572 Pain in left ankle and joints of left foot: Secondary | ICD-10-CM
